# Patient Record
Sex: Female | Born: 1960 | Race: White | Hispanic: No | Marital: Married | Smoking: Never smoker
Health system: Southern US, Community
[De-identification: ages and names within clinical notes are randomized; demographics above are authoritative.]

## PROBLEM LIST (undated history)

## (undated) DIAGNOSIS — E039 Hypothyroidism, unspecified: Secondary | ICD-10-CM

## (undated) DIAGNOSIS — B019 Varicella without complication: Secondary | ICD-10-CM

## (undated) DIAGNOSIS — L570 Actinic keratosis: Secondary | ICD-10-CM

## (undated) DIAGNOSIS — R42 Dizziness and giddiness: Secondary | ICD-10-CM

## (undated) DIAGNOSIS — Z0000 Encounter for general adult medical examination without abnormal findings: Secondary | ICD-10-CM

## (undated) DIAGNOSIS — F419 Anxiety disorder, unspecified: Secondary | ICD-10-CM

## (undated) DIAGNOSIS — F329 Major depressive disorder, single episode, unspecified: Secondary | ICD-10-CM

## (undated) DIAGNOSIS — M25551 Pain in right hip: Secondary | ICD-10-CM

## (undated) DIAGNOSIS — R1013 Epigastric pain: Secondary | ICD-10-CM

## (undated) DIAGNOSIS — E559 Vitamin D deficiency, unspecified: Secondary | ICD-10-CM

## (undated) DIAGNOSIS — Z124 Encounter for screening for malignant neoplasm of cervix: Secondary | ICD-10-CM

## (undated) DIAGNOSIS — K219 Gastro-esophageal reflux disease without esophagitis: Secondary | ICD-10-CM

## (undated) DIAGNOSIS — N6019 Diffuse cystic mastopathy of unspecified breast: Secondary | ICD-10-CM

## (undated) DIAGNOSIS — E785 Hyperlipidemia, unspecified: Secondary | ICD-10-CM

## (undated) DIAGNOSIS — K589 Irritable bowel syndrome without diarrhea: Principal | ICD-10-CM

## (undated) DIAGNOSIS — C801 Malignant (primary) neoplasm, unspecified: Secondary | ICD-10-CM

## (undated) HISTORY — DX: Epigastric pain: R10.13

## (undated) HISTORY — DX: Malignant (primary) neoplasm, unspecified: C80.1

## (undated) HISTORY — DX: Varicella without complication: B01.9

## (undated) HISTORY — PX: BREAST SURGERY: SHX581

## (undated) HISTORY — DX: Encounter for general adult medical examination without abnormal findings: Z00.00

## (undated) HISTORY — DX: Hypothyroidism, unspecified: E03.9

## (undated) HISTORY — DX: Vitamin D deficiency, unspecified: E55.9

## (undated) HISTORY — PX: TONSILLECTOMY: SUR1361

## (undated) HISTORY — DX: Dizziness and giddiness: R42

## (undated) HISTORY — PX: TONSILLECTOMY: SHX5217

## (undated) HISTORY — DX: Major depressive disorder, single episode, unspecified: F32.9

## (undated) HISTORY — DX: Encounter for screening for malignant neoplasm of cervix: Z12.4

## (undated) HISTORY — DX: Actinic keratosis: L57.0

## (undated) HISTORY — PX: AXILLARY LYMPH NODE BIOPSY: SHX5737

## (undated) HISTORY — DX: Irritable bowel syndrome without diarrhea: K58.9

## (undated) HISTORY — DX: Anxiety disorder, unspecified: F41.9

## (undated) HISTORY — DX: Hyperlipidemia, unspecified: E78.5

## (undated) HISTORY — DX: Pain in right hip: M25.551

## (undated) HISTORY — DX: Diffuse cystic mastopathy of unspecified breast: N60.19

---

## 1995-12-16 HISTORY — PX: HEMORRHOID SURGERY: SHX153

## 2010-12-15 DIAGNOSIS — C801 Malignant (primary) neoplasm, unspecified: Secondary | ICD-10-CM

## 2010-12-15 HISTORY — PX: OTHER SURGICAL HISTORY: SHX169

## 2010-12-15 HISTORY — DX: Malignant (primary) neoplasm, unspecified: C80.1

## 2011-10-16 HISTORY — PX: CHOLECYSTECTOMY: SHX55

## 2011-12-16 DIAGNOSIS — E559 Vitamin D deficiency, unspecified: Secondary | ICD-10-CM

## 2011-12-16 HISTORY — DX: Vitamin D deficiency, unspecified: E55.9

## 2012-02-13 LAB — HM PAP SMEAR: HM Pap smear: NORMAL

## 2013-02-18 ENCOUNTER — Ambulatory Visit: Payer: Self-pay | Admitting: Family Medicine

## 2013-04-20 ENCOUNTER — Ambulatory Visit (INDEPENDENT_AMBULATORY_CARE_PROVIDER_SITE_OTHER): Payer: 59 | Admitting: Family Medicine

## 2013-04-20 ENCOUNTER — Encounter: Payer: Self-pay | Admitting: Family Medicine

## 2013-04-20 ENCOUNTER — Encounter: Payer: Self-pay | Admitting: Internal Medicine

## 2013-04-20 VITALS — BP 109/82 | HR 64 | Temp 98.5°F | Ht 65.0 in | Wt 143.0 lb

## 2013-04-20 DIAGNOSIS — F411 Generalized anxiety disorder: Secondary | ICD-10-CM

## 2013-04-20 DIAGNOSIS — Z124 Encounter for screening for malignant neoplasm of cervix: Secondary | ICD-10-CM

## 2013-04-20 DIAGNOSIS — N6019 Diffuse cystic mastopathy of unspecified breast: Secondary | ICD-10-CM

## 2013-04-20 DIAGNOSIS — Z0000 Encounter for general adult medical examination without abnormal findings: Secondary | ICD-10-CM

## 2013-04-20 DIAGNOSIS — Z1211 Encounter for screening for malignant neoplasm of colon: Secondary | ICD-10-CM

## 2013-04-20 DIAGNOSIS — E559 Vitamin D deficiency, unspecified: Secondary | ICD-10-CM

## 2013-04-20 DIAGNOSIS — C801 Malignant (primary) neoplasm, unspecified: Secondary | ICD-10-CM | POA: Insufficient documentation

## 2013-04-20 DIAGNOSIS — F419 Anxiety disorder, unspecified: Secondary | ICD-10-CM

## 2013-04-20 DIAGNOSIS — C4491 Basal cell carcinoma of skin, unspecified: Secondary | ICD-10-CM

## 2013-04-20 DIAGNOSIS — K589 Irritable bowel syndrome without diarrhea: Secondary | ICD-10-CM

## 2013-04-20 DIAGNOSIS — L570 Actinic keratosis: Secondary | ICD-10-CM

## 2013-04-20 HISTORY — DX: Irritable bowel syndrome without diarrhea: K58.9

## 2013-04-20 MED ORDER — ALPRAZOLAM 0.5 MG PO TABS: 0.5000 mg | ORAL_TABLET | ORAL | Status: DC

## 2013-04-20 MED ORDER — TRETINOIN (FACIAL WRINKLES) 0.02 % EX CREA: 1.0000 "application " | TOPICAL_CREAM | Freq: Every day | CUTANEOUS | Status: DC

## 2013-04-20 MED ORDER — BACITRACIN ZINC 500 UNIT/GM EX OINT: 1.0000 "application " | TOPICAL_OINTMENT | Freq: Two times a day (BID) | CUTANEOUS | Status: DC | PRN

## 2013-04-20 NOTE — Patient Instructions (Addendum)
Consider Probiotic such as Digestive Advantage Consider MegaRed Krill oil cap daily  Labs tomorrow will include lipid, renal, cbc, tsh hepatic and Vitamin D    Preventive Care for Adults, Female A healthy lifestyle and preventive care can promote health and wellness. Preventive health guidelines for women include the following key practices.  A routine yearly physical is a good way to check with your caregiver about your health and preventive screening. It is a chance to share any concerns and updates on your health, and to receive a thorough exam.  Visit your dentist for a routine exam and preventive care every 6 months. Brush your teeth twice a day and floss once a day. Good oral hygiene prevents tooth decay and gum disease.  The frequency of eye exams is based on your age, health, family medical history, use of contact lenses, and other factors. Follow your caregiver's recommendations for frequency of eye exams.  Eat a healthy diet. Foods like vegetables, fruits, whole grains, low-fat dairy products, and lean protein foods contain the nutrients you need without too many calories. Decrease your intake of foods high in solid fats, added sugars, and salt. Eat the right amount of calories for you.Get information about a proper diet from your caregiver, if necessary.  Regular physical exercise is one of the most important things you can do for your health. Most adults should get at least 150 minutes of moderate-intensity exercise (any activity that increases your heart rate and causes you to sweat) each week. In addition, most adults need muscle-strengthening exercises on 2 or more days a week.  Maintain a healthy weight. The body mass index (BMI) is a screening tool to identify possible weight problems. It provides an estimate of body fat based on height and weight. Your caregiver can help determine your BMI, and can help you achieve or maintain a healthy weight.For adults 20 years and  older:  A BMI below 18.5 is considered underweight.  A BMI of 18.5 to 24.9 is normal.  A BMI of 25 to 29.9 is considered overweight.  A BMI of 30 and above is considered obese.  Maintain normal blood lipids and cholesterol levels by exercising and minimizing your intake of saturated fat. Eat a balanced diet with plenty of fruit and vegetables. Blood tests for lipids and cholesterol should begin at age 67 and be repeated every 5 years. If your lipid or cholesterol levels are high, you are over 50, or you are at high risk for heart disease, you may need your cholesterol levels checked more frequently.Ongoing high lipid and cholesterol levels should be treated with medicines if diet and exercise are not effective.  If you smoke, find out from your caregiver how to quit. If you do not use tobacco, do not start.  If you are pregnant, do not drink alcohol. If you are breastfeeding, be very cautious about drinking alcohol. If you are not pregnant and choose to drink alcohol, do not exceed 1 drink per day. One drink is considered to be 12 ounces (355 mL) of beer, 5 ounces (148 mL) of wine, or 1.5 ounces (44 mL) of liquor.  Avoid use of street drugs. Do not share needles with anyone. Ask for help if you need support or instructions about stopping the use of drugs.  High blood pressure causes heart disease and increases the risk of stroke. Your blood pressure should be checked at least every 1 to 2 years. Ongoing high blood pressure should be treated with medicines if weight  loss and exercise are not effective.  If you are 65 to 52 years old, ask your caregiver if you should take aspirin to prevent strokes.  Diabetes screening involves taking a blood sample to check your fasting blood sugar level. This should be done once every 3 years, after age 39, if you are within normal weight and without risk factors for diabetes. Testing should be considered at a younger age or be carried out more frequently if  you are overweight and have at least 1 risk factor for diabetes.  Breast cancer screening is essential preventive care for women. You should practice "breast self-awareness." This means understanding the normal appearance and feel of your breasts and may include breast self-examination. Any changes detected, no matter how small, should be reported to a caregiver. Women in their 58s and 30s should have a clinical breast exam (CBE) by a caregiver as part of a regular health exam every 1 to 3 years. After age 70, women should have a CBE every year. Starting at age 70, women should consider having a mammography (breast X-ray test) every year. Women who have a family history of breast cancer should talk to their caregiver about genetic screening. Women at a high risk of breast cancer should talk to their caregivers about having magnetic resonance imaging (MRI) and a mammography every year.  The Pap test is a screening test for cervical cancer. A Pap test can show cell changes on the cervix that might become cervical cancer if left untreated. A Pap test is a procedure in which cells are obtained and examined from the lower end of the uterus (cervix).  Women should have a Pap test starting at age 17.  Between ages 59 and 31, Pap tests should be repeated every 2 years.  Beginning at age 29, you should have a Pap test every 3 years as long as the past 3 Pap tests have been normal.  Some women have medical problems that increase the chance of getting cervical cancer. Talk to your caregiver about these problems. It is especially important to talk to your caregiver if a new problem develops soon after your last Pap test. In these cases, your caregiver may recommend more frequent screening and Pap tests.  The above recommendations are the same for women who have or have not gotten the vaccine for human papillomavirus (HPV).  If you had a hysterectomy for a problem that was not cancer or a condition that could  lead to cancer, then you no longer need Pap tests. Even if you no longer need a Pap test, a regular exam is a good idea to make sure no other problems are starting.  If you are between ages 77 and 48, and you have had normal Pap tests going back 10 years, you no longer need Pap tests. Even if you no longer need a Pap test, a regular exam is a good idea to make sure no other problems are starting.  If you have had past treatment for cervical cancer or a condition that could lead to cancer, you need Pap tests and screening for cancer for at least 20 years after your treatment.  If Pap tests have been discontinued, risk factors (such as a new sexual partner) need to be reassessed to determine if screening should be resumed.  The HPV test is an additional test that may be used for cervical cancer screening. The HPV test looks for the virus that can cause the cell changes on the cervix.  The cells collected during the Pap test can be tested for HPV. The HPV test could be used to screen women aged 1 years and older, and should be used in women of any age who have unclear Pap test results. After the age of 44, women should have HPV testing at the same frequency as a Pap test.  Colorectal cancer can be detected and often prevented. Most routine colorectal cancer screening begins at the age of 80 and continues through age 76. However, your caregiver may recommend screening at an earlier age if you have risk factors for colon cancer. On a yearly basis, your caregiver may provide home test kits to check for hidden blood in the stool. Use of a small camera at the end of a tube, to directly examine the colon (sigmoidoscopy or colonoscopy), can detect the earliest forms of colorectal cancer. Talk to your caregiver about this at age 73, when routine screening begins. Direct examination of the colon should be repeated every 5 to 10 years through age 9, unless early forms of pre-cancerous polyps or small growths are  found.  Hepatitis C blood testing is recommended for all people born from [birth year] through 1965 and any individual with known risks for hepatitis C.  Practice safe sex. Use condoms and avoid high-risk sexual practices to reduce the spread of sexually transmitted infections (STIs). STIs include gonorrhea, chlamydia, syphilis, trichomonas, herpes, HPV, and human immunodeficiency virus (HIV). Herpes, HIV, and HPV are viral illnesses that have no cure. They can result in disability, cancer, and death. Sexually active women aged 24 and younger should be checked for chlamydia. Older women with new or multiple partners should also be tested for chlamydia. Testing for other STIs is recommended if you are sexually active and at increased risk.  Osteoporosis is a disease in which the bones lose minerals and strength with aging. This can result in serious bone fractures. The risk of osteoporosis can be identified using a bone density scan. Women ages 68 and over and women at risk for fractures or osteoporosis should discuss screening with their caregivers. Ask your caregiver whether you should take a calcium supplement or vitamin D to reduce the rate of osteoporosis.  Menopause can be associated with physical symptoms and risks. Hormone replacement therapy is available to decrease symptoms and risks. You should talk to your caregiver about whether hormone replacement therapy is right for you.  Use sunscreen with sun protection factor (SPF) of 30 or more. Apply sunscreen liberally and repeatedly throughout the day. You should seek shade when your shadow is shorter than you. Protect yourself by wearing long sleeves, pants, a wide-brimmed hat, and sunglasses year round, whenever you are outdoors.  Once a month, do a whole body skin exam, using a mirror to look at the skin on your back. Notify your caregiver of new moles, moles that have irregular borders, moles that are larger than a pencil eraser, or moles that have  changed in shape or color.  Stay current with required immunizations.  Influenza. You need a dose every fall (or winter). The composition of the flu vaccine changes each year, so being vaccinated once is not enough.  Pneumococcal polysaccharide. You need 1 to 2 doses if you smoke cigarettes or if you have certain chronic medical conditions. You need 1 dose at age 39 (or older) if you have never been vaccinated.  Tetanus, diphtheria, pertussis (Tdap, Td). Get 1 dose of Tdap vaccine if you are younger than age 59, are over  65 and have contact with an infant, are a Research scientist (physical sciences), are pregnant, or simply want to be protected from whooping cough. After that, you need a Td booster dose every 10 years. Consult your caregiver if you have not had at least 3 tetanus and diphtheria-containing shots sometime in your life or have a deep or dirty wound.  HPV. You need this vaccine if you are a woman age 67 or younger. The vaccine is given in 3 doses over 6 months.  Measles, mumps, rubella (MMR). You need at least 1 dose of MMR if you were born in 1957 or later. You may also need a second dose.  Meningococcal. If you are age 72 to 26 and a first-year college student living in a residence hall, or have one of several medical conditions, you need to get vaccinated against meningococcal disease. You may also need additional booster doses.  Zoster (shingles). If you are age 7 or older, you should get this vaccine.  Varicella (chickenpox). If you have never had chickenpox or you were vaccinated but received only 1 dose, talk to your caregiver to find out if you need this vaccine.  Hepatitis A. You need this vaccine if you have a specific risk factor for hepatitis A virus infection or you simply wish to be protected from this disease. The vaccine is usually given as 2 doses, 6 to 18 months apart.  Hepatitis B. You need this vaccine if you have a specific risk factor for hepatitis B virus infection or you  simply wish to be protected from this disease. The vaccine is given in 3 doses, usually over 6 months. Preventive Services / Frequency Ages 75 to 82  Blood pressure check.** / Every 1 to 2 years.  Lipid and cholesterol check.** / Every 5 years beginning at age 30.  Clinical breast exam.** / Every 3 years for women in their 27s and 30s.  Pap test.** / Every 2 years from ages 14 through 63. Every 3 years starting at age 60 through age 30 or 25 with a history of 3 consecutive normal Pap tests.  HPV screening.** / Every 3 years from ages 10 through ages 49 to 31 with a history of 3 consecutive normal Pap tests.  Hepatitis C blood test.** / For any individual with known risks for hepatitis C.  Skin self-exam. / Monthly.  Influenza immunization.** / Every year.  Pneumococcal polysaccharide immunization.** / 1 to 2 doses if you smoke cigarettes or if you have certain chronic medical conditions.  Tetanus, diphtheria, pertussis (Tdap, Td) immunization. / A one-time dose of Tdap vaccine. After that, you need a Td booster dose every 10 years.  HPV immunization. / 3 doses over 6 months, if you are 73 and younger.  Measles, mumps, rubella (MMR) immunization. / You need at least 1 dose of MMR if you were born in 1957 or later. You may also need a second dose.  Meningococcal immunization. / 1 dose if you are age 22 to 56 and a first-year college student living in a residence hall, or have one of several medical conditions, you need to get vaccinated against meningococcal disease. You may also need additional booster doses.  Varicella immunization.** / Consult your caregiver.  Hepatitis A immunization.** / Consult your caregiver. 2 doses, 6 to 18 months apart.  Hepatitis B immunization.** / Consult your caregiver. 3 doses usually over 6 months. Ages 14 to 93  Blood pressure check.** / Every 1 to 2 years.  Lipid and cholesterol  check.** / Every 5 years beginning at age 55.  Clinical breast  exam.** / Every year after age 1.  Mammogram.** / Every year beginning at age 45 and continuing for as long as you are in good health. Consult with your caregiver.  Pap test.** / Every 3 years starting at age 73 through age 27 or 93 with a history of 3 consecutive normal Pap tests.  HPV screening.** / Every 3 years from ages 7 through ages 60 to 39 with a history of 3 consecutive normal Pap tests.  Fecal occult blood test (FOBT) of stool. / Every year beginning at age 79 and continuing until age 53. You may not need to do this test if you get a colonoscopy every 10 years.  Flexible sigmoidoscopy or colonoscopy.** / Every 5 years for a flexible sigmoidoscopy or every 10 years for a colonoscopy beginning at age 24 and continuing until age 38.  Hepatitis C blood test.** / For all people born from [birth year] through 1965 and any individual with known risks for hepatitis C.  Skin self-exam. / Monthly.  Influenza immunization.** / Every year.  Pneumococcal polysaccharide immunization.** / 1 to 2 doses if you smoke cigarettes or if you have certain chronic medical conditions.  Tetanus, diphtheria, pertussis (Tdap, Td) immunization.** / A one-time dose of Tdap vaccine. After that, you need a Td booster dose every 10 years.  Measles, mumps, rubella (MMR) immunization. / You need at least 1 dose of MMR if you were born in 1957 or later. You may also need a second dose.  Varicella immunization.** / Consult your caregiver.  Meningococcal immunization.** / Consult your caregiver.  Hepatitis A immunization.** / Consult your caregiver. 2 doses, 6 to 18 months apart.  Hepatitis B immunization.** / Consult your caregiver. 3 doses, usually over 6 months. Ages 2 and over  Blood pressure check.** / Every 1 to 2 years.  Lipid and cholesterol check.** / Every 5 years beginning at age 9.  Clinical breast exam.** / Every year after age 48.  Mammogram.** / Every year beginning at age 70 and continuing  for as long as you are in good health. Consult with your caregiver.  Pap test.** / Every 3 years starting at age 74 through age 57 or 48 with a 3 consecutive normal Pap tests. Testing can be stopped between 65 and 70 with 3 consecutive normal Pap tests and no abnormal Pap or HPV tests in the past 10 years.  HPV screening.** / Every 3 years from ages 85 through ages 74 or 56 with a history of 3 consecutive normal Pap tests. Testing can be stopped between 65 and 70 with 3 consecutive normal Pap tests and no abnormal Pap or HPV tests in the past 10 years.  Fecal occult blood test (FOBT) of stool. / Every year beginning at age 61 and continuing until age 37. You may not need to do this test if you get a colonoscopy every 10 years.  Flexible sigmoidoscopy or colonoscopy.** / Every 5 years for a flexible sigmoidoscopy or every 10 years for a colonoscopy beginning at age 62 and continuing until age 81.  Hepatitis C blood test.** / For all people born from [birth year] through 1965 and any individual with known risks for hepatitis C.  Osteoporosis screening.** / A one-time screening for women ages 37 and over and women at risk for fractures or osteoporosis.  Skin self-exam. / Monthly.  Influenza immunization.** / Every year.  Pneumococcal polysaccharide immunization.** / 1 dose at  age 64 (or older) if you have never been vaccinated.  Tetanus, diphtheria, pertussis (Tdap, Td) immunization. / A one-time dose of Tdap vaccine if you are over 65 and have contact with an infant, are a Research scientist (physical sciences), or simply want to be protected from whooping cough. After that, you need a Td booster dose every 10 years.  Varicella immunization.** / Consult your caregiver.  Meningococcal immunization.** / Consult your caregiver.  Hepatitis A immunization.** / Consult your caregiver. 2 doses, 6 to 18 months apart.  Hepatitis B immunization.** / Check with your caregiver. 3 doses, usually over 6 months. ** Family history  and personal history of risk and conditions may change your caregiver's recommendations. Document Released: 01/27/2002 Document Revised: 02/23/2012 Document Reviewed: 04/28/2011 [facility] Patient Information 2013 [place], [place].

## 2013-04-21 ENCOUNTER — Other Ambulatory Visit: Payer: Self-pay | Admitting: Family Medicine

## 2013-04-21 LAB — HEPATIC FUNCTION PANEL
AST: 19 U/L (ref 0–37)
Bilirubin, Direct: 0.1 mg/dL (ref 0.0–0.3)
Indirect Bilirubin: 0.4 mg/dL (ref 0.0–0.9)
Total Bilirubin: 0.5 mg/dL (ref 0.3–1.2)

## 2013-04-21 LAB — RENAL FUNCTION PANEL
CO2: 27 mEq/L (ref 19–32)
Chloride: 105 mEq/L (ref 96–112)
Creat: 0.92 mg/dL (ref 0.50–1.10)
Phosphorus: 3.7 mg/dL (ref 2.3–4.6)
Potassium: 4.7 mEq/L (ref 3.5–5.3)
Sodium: 139 mEq/L (ref 135–145)

## 2013-04-21 LAB — CBC
HCT: 38.9 % (ref 36.0–46.0)
Hemoglobin: 13.1 g/dL (ref 12.0–15.0)
MCV: 92.6 fL (ref 78.0–100.0)
RBC: 4.2 MIL/uL (ref 3.87–5.11)
RDW: 12.9 % (ref 11.5–15.5)
WBC: 5.8 10*3/uL (ref 4.0–10.5)

## 2013-04-21 LAB — LIPID PANEL
LDL Cholesterol: 130 mg/dL — ABNORMAL HIGH (ref 0–99)
VLDL: 11 mg/dL (ref 0–40)

## 2013-04-22 LAB — VITAMIN D 25 HYDROXY (VIT D DEFICIENCY, FRACTURES): Vit D, 25-Hydroxy: 35 ng/mL (ref 30–89)

## 2013-04-24 ENCOUNTER — Encounter: Payer: Self-pay | Admitting: Family Medicine

## 2013-04-24 DIAGNOSIS — Z124 Encounter for screening for malignant neoplasm of cervix: Secondary | ICD-10-CM

## 2013-04-24 HISTORY — DX: Encounter for screening for malignant neoplasm of cervix: Z12.4

## 2013-04-24 NOTE — Assessment & Plan Note (Signed)
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

## 2013-04-24 NOTE — Assessment & Plan Note (Signed)
Referred to dermatology for full body scan and surveillance

## 2013-04-24 NOTE — Assessment & Plan Note (Deleted)
Referred to dermatology for full body scan and treatment. Encouraged sun screen

## 2013-04-24 NOTE — Assessment & Plan Note (Signed)
Will request old records from [place] and will return for pap at next visit.

## 2013-04-24 NOTE — Progress Notes (Signed)
Patient ID: Toni Boone, female   DOB: 1961-09-06, 52 y.o.   MRN: [id number] ILYANNA BAILLARGEON [id number] November 23, 1961 04/24/2013      Progress Note New Patient  Subjective  Chief Complaint  Chief Complaint  Patient presents with  . Establish Care    new patient    HPI  Patient is a 52 year old Caucasian female who is in today to establish care. She is here recently from [place] with her family just acute complaints does have some medical conditions that need to be followed. She struggled with irritable bowel syndrome for quite some time. Has intermittent diarrhea but no bloody or tarry stool. Is over due for her screening colonoscopy. LMP was in March of 2014. Last Pap was a year ago. She was found to have a cervical polyp on D&C but no other abnormality. She has history of basal cell carcinoma in her left shoulder as well as actinic keratosis and a strong family history of skin cancer in no acute complaints. No recent illness. No chest pain, palpitations, shortness of breath, GU complaints are noted  Past Medical History  Diagnosis Date  . Chicken pox as a child  . Anxiety   . 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 Actinic keratosis   . Vitamin D deficiency 2013  . Vitamin D deficiency   . Fibrocystic breast   . IBS (irritable bowel syndrome) 04/20/2013  . Cervical cancer screening 04/24/2013    Has a history of a cervical polyp found on evaluation of some cellular changes, D&C revealed only the polyp LMP 3/14    Past Surgical History  Procedure Laterality Date  . Basal cell removal  2012    left shoulder  . Cholecystectomy  11-12  . Tonsillectomy  as a child  . Cesarean section  89 and 95  . Breast surgery  1996 and 2012    left breast- benign  . Axillary lymph node biopsy  80's    left    Family History  Problem Relation Age of Onset  . Hypertension Mother   . Cancer Mother 15    non hodgkin lymphoma, basal, scc  . Other Mother     bilateral renal stenosis  .  Cancer Father     pituitary adenoma, basal  . Stroke Father 34  . Aneurysm Father 76    brain  . Cancer Brother     basal   . Hypertension Maternal Grandmother   . Heart attack Maternal Grandmother   . Other Maternal Grandmother     ras  . Heart attack Maternal Grandfather   . Stroke Paternal Grandmother   . Emphysema Paternal Grandfather     smoker  . COPD Paternal Grandfather     smoker  . Other Maternal Aunt     RAS/RAS  . Hypertension Maternal Aunt     History   Social History  . Marital Status: Married    Spouse Name: N/A    Number of Children: N/A  . Years of Education: N/A   Occupational History  . Not on file.   Social History Main Topics  . Smoking status: Never Smoker   . Smokeless tobacco: Never Used  . Alcohol Use: Yes     Comment: socially  . Drug Use: No  . Sexually Active: Yes -- Female partner(s)   Other Topics Concern  . Not on file   Social History Narrative  . No narrative on file    No current  outpatient prescriptions on file prior to visit.   No current facility-administered medications on file prior to visit.    Allergies  Allergen Reactions  . Avelox (Moxifloxacin Hcl In Nacl) Anaphylaxis  . Codeine     GI upset  . Macrodantin (Nitrofurantoin Macrocrystal)     Burning throat  . Penicillins     Review of Systems  Review of Systems  Constitutional: Negative for fever, chills and malaise/fatigue.  HENT: Negative for hearing loss, nosebleeds and congestion.   Eyes: Negative for discharge.  Respiratory: Negative for cough, sputum production, shortness of breath and wheezing.   Cardiovascular: Negative for chest pain, palpitations and leg swelling.  Gastrointestinal: Positive for diarrhea. Negative for heartburn, nausea, vomiting, abdominal pain, constipation and blood in stool.  Genitourinary: Negative for dysuria, urgency, frequency and hematuria.  Musculoskeletal: Negative for myalgias, back pain and falls.  Skin: Negative  for rash.  Neurological: Negative for dizziness, tremors, sensory change, focal weakness, loss of consciousness, weakness and headaches.  Endo/Heme/Allergies: Negative for polydipsia. Does not bruise/bleed easily.  Psychiatric/Behavioral: Negative for depression and suicidal ideas. The patient is nervous/anxious. The patient does not have insomnia.     Objective  BP 109/82  Pulse 64  Temp(Src) 98.5 F (36.9 C) (Oral)  Ht 5\' 5"  (1.651 m)  Wt 143 lb (64.864 kg)  BMI 23.8 kg/m2  SpO2 97%  LMP 02/20/2013  Physical Exam  Physical Exam  Constitutional: She is oriented to person, place, and time and well-developed, well-nourished, and in no distress. No distress.  HENT:  Head: Normocephalic and atraumatic.  Right Ear: External ear normal.  Left Ear: External ear normal.  Nose: Nose normal.  Mouth/Throat: Oropharynx is clear and moist. No oropharyngeal exudate.  Eyes: Conjunctivae are normal. Pupils are equal, round, and reactive to light. Right eye exhibits no discharge. Left eye exhibits no discharge. No scleral icterus.  Neck: Normal range of motion. Neck supple. No thyromegaly present.  Cardiovascular: Normal rate, regular rhythm, normal heart sounds and intact distal pulses.   No murmur heard. Pulmonary/Chest: Effort normal and breath sounds normal. No respiratory distress. She has no wheezes. She has no rales.  Abdominal: Soft. Bowel sounds are normal. She exhibits no distension and no mass. There is no tenderness.  Musculoskeletal: Normal range of motion. She exhibits no edema and no tenderness.  Lymphadenopathy:    She has no cervical adenopathy.  Neurological: She is alert and oriented to person, place, and time. She has normal reflexes. No cranial nerve deficit. Coordination normal.  Skin: Skin is warm and dry. No rash noted. She is not diaphoretic.  Psychiatric: Mood, memory and affect normal.       Assessment & Plan  Vitamin D deficiency Getting less sun now.  Supplementing infrequently. Using Vitamin D 800 IU roughly twice a month. Will ceck level with labs.  IBS (irritable bowel syndrome) Encouraged Probiotics, fiber supplements and heart healthy diet. Is in need of screening colonoscopy. Is referred today.  Cancer Referred to dermatology for full body scan and surveillance  Fibrocystic breast She reports she usually gets Diagnostic MGMs due to h/o recurrent abnomalities  Anxiety Uses Alprazolam very infrequently.  Cervical cancer screening Will request old records from [place] and will return for pap at next visit.

## 2013-04-24 NOTE — Assessment & Plan Note (Signed)
Uses Alprazolam very infrequently.

## 2013-04-24 NOTE — Assessment & Plan Note (Addendum)
Encouraged Probiotics, fiber supplements and heart healthy diet. Is in need of screening colonoscopy. Is referred today.

## 2013-04-24 NOTE — Assessment & Plan Note (Signed)
Getting less sun now. Supplementing infrequently. Using Vitamin D 800 IU roughly twice a month. Will ceck level with labs.

## 2013-04-25 ENCOUNTER — Telehealth: Payer: Self-pay | Admitting: *Deleted

## 2013-04-25 NOTE — Telephone Encounter (Signed)
I called to inform pharmacy and she stated that the Tretinoin is only in .05, .025 and .1? Please advise which strength?

## 2013-04-25 NOTE — Telephone Encounter (Signed)
I attempted to just order the generic Tretenoin but you how  The machine just orders with both names. Please call the pharmacy and give an order with same sig to give the generic, thanks

## 2013-04-25 NOTE — Telephone Encounter (Signed)
Try the 0.05 strength version

## 2013-04-25 NOTE — Telephone Encounter (Signed)
Received fax from [facility] that Renova is not covered under pt's insurance. Wants to know if it can be changed to tretenoin?  Please advise.

## 2013-04-26 ENCOUNTER — Telehealth: Payer: Self-pay

## 2013-04-26 DIAGNOSIS — R7989 Other specified abnormal findings of blood chemistry: Secondary | ICD-10-CM

## 2013-04-26 MED ORDER — TRETINOIN 0.05 % EX CREA: TOPICAL_CREAM | Freq: Every day | CUTANEOUS | Status: DC

## 2013-04-26 NOTE — Telephone Encounter (Signed)
Lab order placed.

## 2013-05-04 ENCOUNTER — Ambulatory Visit (AMBULATORY_SURGERY_CENTER): Payer: 59 | Admitting: *Deleted

## 2013-05-04 VITALS — Ht 65.0 in | Wt 145.6 lb

## 2013-05-04 DIAGNOSIS — Z1211 Encounter for screening for malignant neoplasm of colon: Secondary | ICD-10-CM

## 2013-05-04 MED ORDER — MOVIPREP 100 G PO SOLR: ORAL | Status: DC

## 2013-05-19 ENCOUNTER — Ambulatory Visit (AMBULATORY_SURGERY_CENTER): Payer: 59 | Admitting: Internal Medicine

## 2013-05-19 ENCOUNTER — Encounter: Payer: Self-pay | Admitting: Internal Medicine

## 2013-05-19 VITALS — BP 107/65 | HR 55 | Temp 98.5°F | Resp 19 | Ht 65.0 in | Wt 145.0 lb

## 2013-05-19 DIAGNOSIS — Z1211 Encounter for screening for malignant neoplasm of colon: Secondary | ICD-10-CM

## 2013-05-19 MED ORDER — SODIUM CHLORIDE 0.9 % IV SOLN: 500.0000 mL | INTRAVENOUS | Status: DC

## 2013-05-19 NOTE — Op Note (Signed)
[facility] [address] [place], [address]   COLONOSCOPY PROCEDURE REPORT  PATIENT: Toni Boone, Toni Boone  MR#: [id number] BIRTHDATE: Dec 03, 1961 , 51  yrs. old GENDER: Female ENDOSCOPIST: Beverley Fiedler, MD REFERRED XB:JYNWG Abner Greenspan, M.D. PROCEDURE DATE:  05/19/2013 PROCEDURE:   Colonoscopy, screening ASA CLASS:   Class II INDICATIONS:average risk screening and Last colonoscopy performed 12 years ago (for rectal bleeding). MEDICATIONS: MAC sedation, administered by CRNA and propofol (Diprivan) 250mg  IV  DESCRIPTION OF PROCEDURE:   After the risks benefits and alternatives of the procedure were thoroughly explained, informed consent was obtained.  A digital rectal exam revealed no rectal mass.   The LB [id number]-[id number]  endoscope was introduced through the anus and advanced to the cecum, which was identified by both the appendix and ileocecal valve. No adverse events experienced. The quality of the prep was good, using MoviPrep  The instrument was then slowly withdrawn as the colon was fully examined.      COLON FINDINGS: A normal appearing cecum, ileocecal valve, and appendiceal orifice were identified.  The ascending, hepatic flexure, transverse, splenic flexure, descending, sigmoid colon and rectum appeared unremarkable.  No polyps or cancers were seen. Retroflexed views revealed no abnormalities. The time to cecum=3 minutes 25 seconds.  Withdrawal time=8 minutes 29 seconds.  The scope was withdrawn and the procedure completed.  COMPLICATIONS: There were no complications.  ENDOSCOPIC IMPRESSION: Normal colon  RECOMMENDATIONS: You should continue to follow colorectal cancer screening guidelines for "routine risk" patients with a repeat colonoscopy in 10 years. There is no need for FOBT (stool) testing for at least 5 years.   eSigned:  Beverley Fiedler, MD 05/19/2013 10:58 AM   cc: The Patient

## 2013-05-19 NOTE — Patient Instructions (Addendum)
YOU HAD AN ENDOSCOPIC PROCEDURE TODAY AT THE [facility]: Refer to the procedure report that was given to you for any specific questions about what was found during the examination.  If the procedure report does not answer your questions, please call your gastroenterologist to clarify.  If you requested that your care partner not be given the details of your procedure findings, then the procedure report has been included in a sealed envelope for you to review at your convenience later.  YOU SHOULD EXPECT: Some feelings of bloating in the abdomen. Passage of more gas than usual.  Walking can help get rid of the air that was put into your GI tract during the procedure and reduce the bloating. If you had a lower endoscopy (such as a colonoscopy or flexible sigmoidoscopy) you may notice spotting of blood in your stool or on the toilet paper. If you underwent a bowel prep for your procedure, then you may not have a normal bowel movement for a few days.  DIET: Your first meal following the procedure should be a light meal and then it is ok to progress to your normal diet.  A half-sandwich or bowl of soup is an example of a good first meal.  Heavy or fried foods are harder to digest and may make you feel nauseous or bloated.  Likewise meals heavy in dairy and vegetables can cause extra gas to form and this can also increase the bloating.  Drink plenty of fluids but you should avoid alcoholic beverages for 24 hours.  ACTIVITY: Your care partner should take you home directly after the procedure.  You should plan to take it easy, moving slowly for the rest of the day.  You can resume normal activity the day after the procedure however you should NOT DRIVE or use heavy machinery for 24 hours (because of the sedation medicines used during the test).    SYMPTOMS TO REPORT IMMEDIATELY: A gastroenterologist can be reached at any hour.  During normal business hours, 8:30 AM to 5:00 PM Monday through Friday,  call [phone].  After hours and on weekends, please call the GI answering service at [phone] who will take a message and have the physician on call contact you.   Following lower endoscopy (colonoscopy or flexible sigmoidoscopy):  Excessive amounts of blood in the stool  Significant tenderness or worsening of abdominal pains  Swelling of the abdomen that is new, acute  Fever of 100F or higher  FOLLOW UP: If any biopsies were taken you will be contacted by phone or by letter within the next 1-3 weeks.  Call your gastroenterologist if you have not heard about the biopsies in 3 weeks.  Our staff will call the home number listed on your records the next business day following your procedure to check on you and address any questions or concerns that you may have at that time regarding the information given to you following your procedure. This is a courtesy call and so if there is no answer at the home number and we have not heard from you through the emergency physician on call, we will assume that you have returned to your regular daily activities without incident.  SIGNATURES/CONFIDENTIALITY: You and/or your care partner have signed paperwork which will be entered into your electronic medical record.  These signatures attest to the fact that that the information above on your After Visit Summary has been reviewed and is understood.  Full responsibility of the confidentiality of this   discharge information lies with you and/or your care-partner.  Repeat colonoscopy in 10 years.  

## 2013-05-19 NOTE — Progress Notes (Signed)
Patient did not experience any of the following events: a burn prior to discharge; a fall within the facility; wrong site/side/patient/procedure/implant event; or a hospital transfer or hospital admission upon discharge from the facility. (G8907) Patient did not have preoperative order for IV antibiotic SSI prophylaxis. (G8918)  

## 2013-05-20 ENCOUNTER — Telehealth: Payer: Self-pay | Admitting: *Deleted

## 2013-05-20 NOTE — Telephone Encounter (Signed)
Left message that we called for f/u 

## 2013-06-02 ENCOUNTER — Ambulatory Visit (INDEPENDENT_AMBULATORY_CARE_PROVIDER_SITE_OTHER): Payer: 59 | Admitting: Family Medicine

## 2013-06-02 ENCOUNTER — Encounter: Payer: Self-pay | Admitting: Family Medicine

## 2013-06-02 ENCOUNTER — Other Ambulatory Visit (HOSPITAL_COMMUNITY)
Admission: RE | Admit: 2013-06-02 | Discharge: 2013-06-02 | Disposition: A | Discharge status: Home / Self Care | Payer: 59 | Source: Ambulatory Visit | Attending: Family Medicine | Admitting: Family Medicine

## 2013-06-02 VITALS — BP 100/82 | HR 55 | Temp 98.2°F | Ht 65.0 in | Wt 143.1 lb

## 2013-06-02 DIAGNOSIS — Z0000 Encounter for general adult medical examination without abnormal findings: Secondary | ICD-10-CM

## 2013-06-02 DIAGNOSIS — N644 Mastodynia: Secondary | ICD-10-CM

## 2013-06-02 DIAGNOSIS — R946 Abnormal results of thyroid function studies: Secondary | ICD-10-CM

## 2013-06-02 DIAGNOSIS — Z124 Encounter for screening for malignant neoplasm of cervix: Secondary | ICD-10-CM

## 2013-06-02 DIAGNOSIS — N6019 Diffuse cystic mastopathy of unspecified breast: Secondary | ICD-10-CM

## 2013-06-02 DIAGNOSIS — Z01419 Encounter for gynecological examination (general) (routine) without abnormal findings: Secondary | ICD-10-CM | POA: Insufficient documentation

## 2013-06-02 DIAGNOSIS — Z1239 Encounter for other screening for malignant neoplasm of breast: Secondary | ICD-10-CM

## 2013-06-02 DIAGNOSIS — E785 Hyperlipidemia, unspecified: Secondary | ICD-10-CM

## 2013-06-02 DIAGNOSIS — K589 Irritable bowel syndrome without diarrhea: Secondary | ICD-10-CM

## 2013-06-02 DIAGNOSIS — E559 Vitamin D deficiency, unspecified: Secondary | ICD-10-CM

## 2013-06-02 HISTORY — DX: Encounter for general adult medical examination without abnormal findings: Z00.00

## 2013-06-02 HISTORY — DX: Hyperlipidemia, unspecified: E78.5

## 2013-06-02 LAB — TSH: TSH: 5.905 u[IU]/mL — ABNORMAL HIGH (ref 0.350–4.500)

## 2013-06-02 NOTE — Assessment & Plan Note (Signed)
Avoid offending foods and consider probiotics

## 2013-06-02 NOTE — Assessment & Plan Note (Signed)
Avoid trans fats, start krill oil caps, increase exercise. Recheck annually

## 2013-06-02 NOTE — Assessment & Plan Note (Signed)
Improved on recent labs.

## 2013-06-02 NOTE — Assessment & Plan Note (Addendum)
Tenderness and fullness noted in upper, outer quadrant of right breast, time for annual mgm, will order diagnostic mgm

## 2013-06-02 NOTE — Assessment & Plan Note (Signed)
Pap today, MGM ordered, annual labs reviewed with patient

## 2013-06-02 NOTE — Assessment & Plan Note (Addendum)
Pap done today, no concerns on exam 

## 2013-06-02 NOTE — Progress Notes (Signed)
Patient ID: Toni Boone, female   DOB: 08-06-1961, 52 y.o.   MRN: [id number] Toni Boone [id number] 08-20-1961 06/02/2013      Progress Note-Follow Up  Subjective  Chief Complaint  Chief Complaint  Patient presents with  . Gynecologic Exam    breast exam and pap    HPI  This is a 52 year old Caucasian female in GYN exam. She is doing well. Labs are reviewed with her today that visit. She's not had any recent illness. No chest pain, palpitations, shortness of breath, GI or GU concerns noted today. She does note she has a history of fibrocystic breast disease with multiple biopsies on the left in the past. Presently on in the right breast upper outer quadrant. No discharge or other complaints.  Past Medical History  Diagnosis Date  . Chicken pox as a child  . Anxiety   . 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 Actinic keratosis   . Vitamin D deficiency 2013  . Vitamin D deficiency   . Fibrocystic breast   . IBS (irritable bowel syndrome) 04/20/2013  . Cervical cancer screening 04/24/2013    Has a history of a cervical polyp found on evaluation of some cellular changes, D&C revealed only the polyp LMP 3/14  . Other and unspecified hyperlipidemia 06/02/2013    Past Surgical History  Procedure Laterality Date  . Basal cell removal  2012    left shoulder  . Cholecystectomy  11-12  . Tonsillectomy  as a child  . Cesarean section  94 and 95  . Breast surgery  1996 and 2012    left breast- benign  . Axillary lymph node biopsy  80's    left  . Hemorrhoid surgery  1997    Family History  Problem Relation Age of Onset  . Hypertension Mother   . Cancer Mother 54    non hodgkin lymphoma, basal, scc  . Other Mother     bilateral renal stenosis  . Cancer Father     pituitary adenoma, basal  . Stroke Father 20  . Aneurysm Father 76    brain  . Cancer Brother     basal   . Hypertension Maternal Grandmother   . Heart attack Maternal Grandmother   . Other Maternal  Grandmother     ras  . Heart attack Maternal Grandfather   . Stroke Paternal Grandmother   . Emphysema Paternal Grandfather     smoker  . COPD Paternal Grandfather     smoker  . Other Maternal Aunt     RAS/RAS  . Hypertension Maternal Aunt   . Colon cancer Neg Hx     History   Social History  . Marital Status: Married    Spouse Name: N/A    Number of Children: N/A  . Years of Education: N/A   Occupational History  . Not on file.   Social History Main Topics  . Smoking status: Never Smoker   . Smokeless tobacco: Never Used  . Alcohol Use: 2.4 oz/week    4 Glasses of wine per week  . Drug Use: No  . Sexually Active: Yes -- Female partner(s)   Other Topics Concern  . Not on file   Social History Narrative  . No narrative on file    Current Outpatient Prescriptions on File Prior to Visit  Medication Sig Dispense Refill  . ALPRAZolam (XANAX) 0.5 MG tablet Take 1 tablet (0.5 mg total) by mouth as directed. 1/2 to  1 tablet prn  30 tablet  1  . bacitracin ointment Apply 1 application topically 2 (two) times daily as needed.  120 g  5  . tretinoin (RETIN-A) 0.05 % cream Apply topically at bedtime.  45 g  1   No current facility-administered medications on file prior to visit.    Allergies  Allergen Reactions  . Avelox (Moxifloxacin Hcl In Nacl) Anaphylaxis  . Penicillins Anaphylaxis  . Codeine     GI upset  . Macrodantin (Nitrofurantoin Macrocrystal)     Burning throat    Review of Systems  Review of Systems  Constitutional: Negative for fever, chills and malaise/fatigue.  HENT: Negative for hearing loss, nosebleeds and congestion.   Eyes: Negative for pain and discharge.  Respiratory: Negative for cough, sputum production, shortness of breath and wheezing.   Cardiovascular: Negative for chest pain, palpitations and leg swelling.  Gastrointestinal: Negative for heartburn, nausea, vomiting, abdominal pain, diarrhea, constipation and blood in stool.   Genitourinary: Negative for dysuria, urgency, frequency and hematuria.  Musculoskeletal: Negative for myalgias, back pain and falls.  Skin: Negative for rash.  Neurological: Negative for dizziness, tremors, sensory change, focal weakness, loss of consciousness, weakness and headaches.  Endo/Heme/Allergies: Negative for polydipsia. Does not bruise/bleed easily.  Psychiatric/Behavioral: Negative for depression and suicidal ideas. The patient is nervous/anxious. The patient does not have insomnia.     Objective  BP 100/82  Pulse 55  Temp(Src) 98.2 F (36.8 C) (Oral)  Ht 5\' 5"  (1.651 m)  Wt 143 lb 1.3 oz (64.901 kg)  BMI 23.81 kg/m2  SpO2 97%  LMP 05/13/2013  Physical Exam  Physical Exam  Constitutional: She is oriented to person, place, and time and well-developed, well-nourished, and in no distress. No distress.  HENT:  Head: Normocephalic and atraumatic.  Eyes: Conjunctivae are normal.  Neck: Neck supple. No thyromegaly present.  Cardiovascular: Normal rate, regular rhythm and normal heart sounds.   No murmur heard. Pulmonary/Chest: Effort normal and breath sounds normal. She has no wheezes.  Abdominal: She exhibits no distension and no mass.  Genitourinary: Vagina normal, uterus normal, cervix normal, right adnexa normal and left adnexa normal. No vaginal discharge found.  Right breast upper outer quadrant,increased density without discreet lesion. No discharge or skin changes  Musculoskeletal: She exhibits no edema.  Lymphadenopathy:    She has no cervical adenopathy.  Neurological: She is alert and oriented to person, place, and time.  Skin: Skin is warm and dry. No rash noted. She is not diaphoretic.  Psychiatric: Memory, affect and judgment normal.    Lab Results  Component Value Date   TSH 5.014* 04/21/2013   Lab Results  Component Value Date   WBC 5.8 04/21/2013   HGB 13.1 04/21/2013   HCT 38.9 04/21/2013   MCV 92.6 04/21/2013   PLT 246 04/21/2013   Lab Results   Component Value Date   CREATININE 0.92 04/21/2013   BUN 13 04/21/2013   NA 139 04/21/2013   K 4.7 04/21/2013   CL 105 04/21/2013   CO2 27 04/21/2013   Lab Results  Component Value Date   ALT 17 04/21/2013   AST 19 04/21/2013   ALKPHOS 47 04/21/2013   BILITOT 0.5 04/21/2013   Lab Results  Component Value Date   CHOL 199 04/21/2013   Lab Results  Component Value Date   HDL 58 04/21/2013   Lab Results  Component Value Date   LDLCALC 130* 04/21/2013   Lab Results  Component Value Date  TRIG 54 04/21/2013   Lab Results  Component Value Date   CHOLHDL 3.4 04/21/2013   A/P  Cervical cancer screening Pap done today, no concerns on exam  Fibrocystic breast Tenderness and fullness noted in upper, outer quadrant of right breast, time for annual mgm, will order diagnostic mgm  Other and unspecified hyperlipidemia Avoid trans fats, start krill oil caps, increase exercise. Recheck annually  Vitamin D deficiency Improved on recent labs  IBS (irritable bowel syndrome) Avoid offending foods and consider probiotics  Preventative health care Pap today, MGM ordered, annual labs reviewed with patient

## 2013-06-02 NOTE — Patient Instructions (Addendum)
Needs TSH and Free T4 in 3 months Next visit annual with labs prior lipid, renal, cbc, tsh, freeT4, hepatic, vitamin d   Hypothyroidism The thyroid is a large gland located in the lower front of your neck. The thyroid gland helps control metabolism. Metabolism is how your body handles food. It controls metabolism with the hormone thyroxine. When this gland is underactive (hypothyroid), it produces too little hormone.  CAUSES These include:   Absence or destruction of thyroid tissue.  Goiter due to iodine deficiency.  Goiter due to medications.  Congenital defects (since birth).  Problems with the pituitary. This causes a lack of TSH (thyroid stimulating hormone). This hormone tells the thyroid to turn out more hormone. SYMPTOMS  Lethargy (feeling as though you have no energy)  Cold intolerance  Weight gain (in spite of normal food intake)  Dry skin  Coarse hair  Menstrual irregularity (if severe, may lead to infertility)  Slowing of thought processes Cardiac problems are also caused by insufficient amounts of thyroid hormone. Hypothyroidism in the newborn is cretinism, and is an extreme form. It is important that this form be treated adequately and immediately or it will lead rapidly to retarded physical and mental development. DIAGNOSIS  To prove hypothyroidism, your caregiver may do blood tests and ultrasound tests. Sometimes the signs are hidden. It may be necessary for your caregiver to watch this illness with blood tests either before or after diagnosis and treatment. TREATMENT  Low levels of thyroid hormone are increased by using synthetic thyroid hormone. This is a safe, effective treatment. It usually takes about four weeks to gain the full effects of the medication. After you have the full effect of the medication, it will generally take another four weeks for problems to leave. Your caregiver may start you on low doses. If you have had heart problems the dose may be  gradually increased. It is generally not an emergency to get rapidly to normal. HOME CARE INSTRUCTIONS   Take your medications as your caregiver suggests. Let your caregiver know of any medications you are taking or start taking. Your caregiver will help you with dosage schedules.  As your condition improves, your dosage needs may increase. It will be necessary to have continuing blood tests as suggested by your caregiver.  Report all suspected medication side effects to your caregiver. SEEK MEDICAL CARE IF: Seek medical care if you develop:  Sweating.  Tremulousness (tremors).  Anxiety.  Rapid weight loss.  Heat intolerance.  Emotional swings.  Diarrhea.  Weakness. SEEK IMMEDIATE MEDICAL CARE IF:  You develop chest pain, an irregular heart beat (palpitations), or a rapid heart beat. MAKE SURE YOU:   Understand these instructions.  Will watch your condition.  Will get help right away if you are not doing well or get worse. Document Released: 12/01/2005 Document Revised: 02/23/2012 Document Reviewed: 07/21/2008 [facility] Patient Information 2014 [place], [place].

## 2013-06-06 ENCOUNTER — Telehealth: Payer: Self-pay

## 2013-06-06 NOTE — Telephone Encounter (Signed)
Pt informed

## 2013-06-06 NOTE — Telephone Encounter (Signed)
Message copied by Court Joy on Mon Jun 06, 2013  5:06 PM ------      Message from: Danise Edge A      Created: Mon Jun 06, 2013  5:05 PM       Please notify normal pap ------

## 2013-06-27 ENCOUNTER — Other Ambulatory Visit: Payer: Self-pay | Admitting: Family Medicine

## 2013-06-27 ENCOUNTER — Ambulatory Visit
Admission: RE | Admit: 2013-06-27 | Discharge: 2013-06-27 | Disposition: A | Discharge status: Home / Self Care | Payer: 59 | Source: Ambulatory Visit | Attending: Family Medicine | Admitting: Family Medicine

## 2013-06-27 DIAGNOSIS — N644 Mastodynia: Secondary | ICD-10-CM

## 2013-06-27 DIAGNOSIS — Z1239 Encounter for other screening for malignant neoplasm of breast: Secondary | ICD-10-CM

## 2013-07-04 ENCOUNTER — Ambulatory Visit (HOSPITAL_BASED_OUTPATIENT_CLINIC_OR_DEPARTMENT_OTHER): Payer: 59

## 2013-08-10 ENCOUNTER — Telehealth: Payer: Self-pay

## 2013-08-10 DIAGNOSIS — R7989 Other specified abnormal findings of blood chemistry: Secondary | ICD-10-CM

## 2013-08-10 MED ORDER — LEVOTHYROXINE SODIUM 25 MCG PO TABS: 25.0000 ug | ORAL_TABLET | Freq: Every day | ORAL | Status: DC

## 2013-08-10 NOTE — Telephone Encounter (Signed)
I am willing to do that, start Levothyroxine 25 mcg po daily disp #30 with 2 refills and recheck TSH and free T4 in 10 to 12 weeks

## 2013-08-10 NOTE — Telephone Encounter (Signed)
Patient informed. RX sent and labs ordered

## 2013-08-10 NOTE — Telephone Encounter (Signed)
Pt left a message stating that since her thyroid test was borderline, she would like to start on the lowest dose of thyroid replacement to see if this helps with her not having any energy and tired all the time?  Please advise?

## 2013-10-20 ENCOUNTER — Other Ambulatory Visit: Payer: Self-pay

## 2013-11-03 ENCOUNTER — Other Ambulatory Visit (INDEPENDENT_AMBULATORY_CARE_PROVIDER_SITE_OTHER): Payer: 59

## 2013-11-03 DIAGNOSIS — E039 Hypothyroidism, unspecified: Secondary | ICD-10-CM

## 2013-11-29 ENCOUNTER — Other Ambulatory Visit: Payer: Self-pay | Admitting: Family Medicine

## 2014-03-15 ENCOUNTER — Other Ambulatory Visit: Payer: Self-pay | Admitting: Family Medicine

## 2014-06-05 ENCOUNTER — Encounter: Payer: 59 | Admitting: Family Medicine

## 2014-06-19 ENCOUNTER — Other Ambulatory Visit: Payer: Self-pay | Admitting: Dermatology

## 2014-07-11 ENCOUNTER — Encounter: Payer: Self-pay | Admitting: Family

## 2014-07-11 ENCOUNTER — Telehealth (INDEPENDENT_AMBULATORY_CARE_PROVIDER_SITE_OTHER): Payer: 59

## 2014-07-11 DIAGNOSIS — R350 Frequency of micturition: Secondary | ICD-10-CM

## 2014-07-11 LAB — POCT URINALYSIS DIPSTICK
Glucose, UA: NEGATIVE
Leukocytes, UA: NEGATIVE
NITRITE UA: NEGATIVE
PH UA: 5
RBC UA: NEGATIVE
SPEC GRAV UA: 1.02
Urobilinogen, UA: 0.2

## 2014-07-11 MED ORDER — CEFUROXIME AXETIL 500 MG PO TABS: 500.0000 mg | ORAL_TABLET | Freq: Two times a day (BID) | ORAL | Status: DC

## 2014-07-11 NOTE — Telephone Encounter (Signed)
Patient is having urinary frequency and per Melissa check a urinalysis and culture  Orders placed

## 2014-07-11 NOTE — Telephone Encounter (Signed)
Pt reports symptoms present x 3 weeks.  Requests rx for ceftin.  + Pen allergy, but reports she has taken ceftin and has tolerated in past.

## 2014-07-14 ENCOUNTER — Encounter: Payer: Self-pay | Admitting: Family

## 2014-07-14 LAB — CULTURE, URINE COMPREHENSIVE: Colony Count: 10000

## 2014-07-17 ENCOUNTER — Other Ambulatory Visit: Payer: Self-pay | Admitting: Family Medicine

## 2014-07-28 ENCOUNTER — Encounter (HOSPITAL_COMMUNITY): Payer: Self-pay | Admitting: Emergency Medicine

## 2014-07-28 ENCOUNTER — Emergency Department (HOSPITAL_COMMUNITY): Payer: 59

## 2014-07-28 ENCOUNTER — Emergency Department (HOSPITAL_COMMUNITY): Admission: EM | Admit: 2014-07-28 | Discharge: 2014-07-28 | Discharge status: Against Medical Advice | Payer: Self-pay

## 2014-07-28 ENCOUNTER — Emergency Department (HOSPITAL_COMMUNITY)
Admission: EM | Admit: 2014-07-28 | Discharge: 2014-07-28 | Disposition: A | Discharge status: Home / Self Care | Payer: 59 | Attending: Emergency Medicine | Admitting: Emergency Medicine

## 2014-07-28 DIAGNOSIS — Z8742 Personal history of other diseases of the female genital tract: Secondary | ICD-10-CM | POA: Diagnosis not present

## 2014-07-28 DIAGNOSIS — F411 Generalized anxiety disorder: Secondary | ICD-10-CM | POA: Insufficient documentation

## 2014-07-28 DIAGNOSIS — R200 Anesthesia of skin: Secondary | ICD-10-CM

## 2014-07-28 DIAGNOSIS — R202 Paresthesia of skin: Secondary | ICD-10-CM

## 2014-07-28 DIAGNOSIS — Z8619 Personal history of other infectious and parasitic diseases: Secondary | ICD-10-CM | POA: Insufficient documentation

## 2014-07-28 DIAGNOSIS — Z880 Allergy status to penicillin: Secondary | ICD-10-CM | POA: Insufficient documentation

## 2014-07-28 DIAGNOSIS — Z85828 Personal history of other malignant neoplasm of skin: Secondary | ICD-10-CM | POA: Diagnosis not present

## 2014-07-28 DIAGNOSIS — R110 Nausea: Secondary | ICD-10-CM | POA: Diagnosis not present

## 2014-07-28 DIAGNOSIS — Z8719 Personal history of other diseases of the digestive system: Secondary | ICD-10-CM | POA: Diagnosis not present

## 2014-07-28 DIAGNOSIS — R519 Headache, unspecified: Secondary | ICD-10-CM

## 2014-07-28 DIAGNOSIS — R51 Headache: Secondary | ICD-10-CM | POA: Insufficient documentation

## 2014-07-28 DIAGNOSIS — R209 Unspecified disturbances of skin sensation: Secondary | ICD-10-CM | POA: Insufficient documentation

## 2014-07-28 DIAGNOSIS — Z79899 Other long term (current) drug therapy: Secondary | ICD-10-CM | POA: Insufficient documentation

## 2014-07-28 DIAGNOSIS — Z872 Personal history of diseases of the skin and subcutaneous tissue: Secondary | ICD-10-CM | POA: Diagnosis not present

## 2014-07-28 DIAGNOSIS — R55 Syncope and collapse: Secondary | ICD-10-CM | POA: Diagnosis not present

## 2014-07-28 LAB — CBC
HCT: 40.4 % (ref 36.0–46.0)
Hemoglobin: 14 g/dL (ref 12.0–15.0)
MCH: 31.7 pg (ref 26.0–34.0)
MCHC: 34.7 g/dL (ref 30.0–36.0)
MCV: 91.6 fL (ref 78.0–100.0)
Platelets: 266 10*3/uL (ref 150–400)
RBC: 4.41 MIL/uL (ref 3.87–5.11)
RDW: 12.2 % (ref 11.5–15.5)
WBC: 7.3 10*3/uL (ref 4.0–10.5)

## 2014-07-28 LAB — BASIC METABOLIC PANEL
Anion gap: 14 (ref 5–15)
BUN: 14 mg/dL (ref 6–23)
CO2: 25 mEq/L (ref 19–32)
Calcium: 9.7 mg/dL (ref 8.4–10.5)
Chloride: 101 mEq/L (ref 96–112)
Creatinine, Ser: 0.95 mg/dL (ref 0.50–1.10)
GFR calc Af Amer: 78 mL/min — ABNORMAL LOW (ref >90)
GFR calc non Af Amer: 67 mL/min — ABNORMAL LOW (ref >90)
Glucose, Bld: 98 mg/dL (ref 70–99)
Potassium: 3.5 mEq/L — ABNORMAL LOW (ref 3.7–5.3)
Sodium: 140 mEq/L (ref 137–147)

## 2014-07-28 LAB — CSF CELL COUNT WITH DIFFERENTIAL
RBC COUNT CSF: 1 /mm3 — AB
RBC COUNT CSF: 63 /mm3 — AB
Tube #: 1
Tube #: 3
WBC CSF: 0 /mm3 (ref 0–5)
WBC, CSF: 0 /mm3 (ref 0–5)

## 2014-07-28 LAB — GLUCOSE, CSF: Glucose, CSF: 60 mg/dL (ref 43–76)

## 2014-07-28 LAB — PROTEIN, CSF: TOTAL PROTEIN, CSF: 38 mg/dL (ref 15–45)

## 2014-07-28 MED ORDER — LIDOCAINE-EPINEPHRINE 2 %-1:100000 IJ SOLN
20.0000 mL | Freq: Once | INTRAMUSCULAR | Status: DC
  Filled 2014-07-28: qty 1

## 2014-07-28 MED ORDER — LORAZEPAM 1 MG PO TABS
1.0000 mg | ORAL_TABLET | Freq: Once | ORAL | Status: AC
  Administered 2014-07-28: 1 mg via ORAL
  Filled 2014-07-28: qty 1

## 2014-07-28 NOTE — ED Provider Notes (Signed)
CSN: [id number]     Arrival date & time 07/28/14  1520 History   First MD Initiated Contact with Patient 07/28/14 1604     Chief Complaint  Patient presents with  . Headache  . Nausea  . Numbness     (Consider location/radiation/quality/duration/timing/severity/associated sxs/prior Treatment) HPI Pt is a 53yo female with hx of anxiety, IBS, and hyperlipidemia presenting to ED with c/o sudden onset sharp pain to top of her head then numbness to right side of her face and right arm while she was shopping earlier today.  Pt also reports episode of near syncope and sudden onset of nausea when headache started around 3pm this afternoon.  Pt states her vision "went pale" in her right eye.  Pt described right sided facial numbness felt similar to Novocain but states numbness is subsiding. Denies change in vision. Denies fever or vomiting. Does report hx of headaches including a severe headache on top of her head last night but states she took ibuprofen went to bed and wake up without any pain at all this morning. States she felt great this morning. Denies being evaluated by a neurologist for headaches. Denies hx of similar symptoms. Denies hx of HTN. Denies hx of recent head trauma.     Past Medical History  Diagnosis Date  . Chicken pox as a child  . Anxiety   . 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 Actinic keratosis   . Vitamin D deficiency 2013  . Vitamin D deficiency   . Fibrocystic breast   . IBS (irritable bowel syndrome) 04/20/2013  . Cervical cancer screening 04/24/2013    Has a history of a cervical polyp found on evaluation of some cellular changes, D&C revealed only the polyp LMP 3/14  . Other and unspecified hyperlipidemia 06/02/2013  . Preventative health care 06/02/2013   Past Surgical History  Procedure Laterality Date  . Basal cell removal  2012    left shoulder  . Cholecystectomy  11-12  . Tonsillectomy  as a child  . Cesarean section  89 and 89  . Breast surgery   1996 and 2012    left breast- benign  . Axillary lymph node biopsy  80's    left  . Hemorrhoid surgery  1997   Family History  Problem Relation Age of Onset  . Hypertension Mother   . Cancer Mother 47    non hodgkin lymphoma, basal, scc  . Other Mother     bilateral renal stenosis  . Cancer Father     pituitary adenoma, basal  . Stroke Father 13  . Aneurysm Father 62    brain  . Cancer Brother     basal   . Hypertension Maternal Grandmother   . Heart attack Maternal Grandmother   . Other Maternal Grandmother     ras  . Heart attack Maternal Grandfather   . Stroke Paternal Grandmother   . Emphysema Paternal Grandfather     smoker  . COPD Paternal Grandfather     smoker  . Other Maternal Aunt     RAS/RAS  . Hypertension Maternal Aunt   . Colon cancer Neg Hx    History  Substance Use Topics  . Smoking status: Never Smoker   . Smokeless tobacco: Never Used  . Alcohol Use: 2.4 oz/week    4 Glasses of wine per week   OB History   Grav Para Term Preterm Abortions TAB SAB Ect Mult Living  Review of Systems  Constitutional: Negative for fever and chills.  Respiratory: Negative for cough and shortness of breath.   Cardiovascular: Negative for chest pain and palpitations.  Gastrointestinal: Positive for nausea ( intermittent). Negative for vomiting and abdominal pain.  Neurological: Positive for syncope ( "near syncope"), numbness and headaches. Negative for dizziness and weakness.  All other systems reviewed and are negative.     Allergies  Avelox; Penicillins; Codeine; Macrodantin; and Aspirin  Home Medications   Prior to Admission medications   Medication Sig Start Date End Date Taking? Authorizing Provider  ALPRAZolam Duanne Moron) 0.5 MG tablet Take 0.25-0.5 mg by mouth daily as needed for anxiety. 04/20/13  Yes Mosie Lukes, MD  levothyroxine (SYNTHROID, LEVOTHROID) 25 MCG tablet Take 25 mcg by mouth daily before breakfast.   Yes Historical  Provider, MD  Multiple Vitamin (MULTIVITAMIN WITH MINERALS) TABS tablet Take 1 tablet by mouth daily.   Yes Historical Provider, MD  OVER THE COUNTER MEDICATION Apply 1 application topically 3 (three) times a week. OTC progesterone cream.   Yes Historical Provider, MD  tretinoin (RETIN-A) 0.05 % cream Apply topically at bedtime. 04/26/13  Yes Mosie Lukes, MD   BP 108/52  Pulse 60  Temp(Src) 98.3 F (36.8 C) (Oral)  Resp 20  SpO2 99% Physical Exam  Nursing note and vitals reviewed. Constitutional: She is oriented to person, place, and time. She appears well-developed and well-nourished. No distress.  Pt lying comfortably in exam bed, NAD.   HENT:  Head: Normocephalic and atraumatic.  Eyes: Conjunctivae are normal. No scleral icterus.  Neck: Normal range of motion. Neck supple.  No nuchal rigidity or meningeal signs  Cardiovascular: Normal rate, regular rhythm and normal heart sounds.   Pulmonary/Chest: Effort normal and breath sounds normal. No respiratory distress. She has no wheezes. She has no rales. She exhibits no tenderness.  Abdominal: Soft. Bowel sounds are normal. She exhibits no distension and no mass. There is no tenderness. There is no rebound and no guarding.  Musculoskeletal: Normal range of motion.  Neurological: She is alert and oriented to person, place, and time. She has normal strength. No sensory deficit. She displays a negative Romberg sign. Coordination and gait normal. GCS eye subscore is 4. GCS verbal subscore is 5. GCS motor subscore is 6.  CN II-XII in tact, no focal deficit, nl finger to nose coordination. Nl sensation, 5/5 strength in all major muscle groups. Neg romberg and nl gait.  Skin: Skin is warm and dry. She is not diaphoretic.    ED Course  Procedures (including critical care time) Labs Review Labs Reviewed  BASIC METABOLIC PANEL - Abnormal; Notable for the following:    Potassium 3.5 (*)    GFR calc non Af Amer 67 (*)    GFR calc Af Amer 78  (*)    All other components within normal limits  CSF CELL COUNT WITH DIFFERENTIAL - Abnormal; Notable for the following:    RBC Count, CSF 63 (*)    All other components within normal limits  CSF CELL COUNT WITH DIFFERENTIAL - Abnormal; Notable for the following:    RBC Count, CSF 1 (*)    All other components within normal limits  GRAM STAIN  CSF CULTURE  CBC  PROTEIN, CSF  GLUCOSE, CSF    Imaging Review Ct Head Wo Contrast  07/28/2014   CLINICAL DATA:  Acute onset of headache with numbness in the right face and arm.  EXAM: CT HEAD WITHOUT CONTRAST  TECHNIQUE:  Contiguous axial images were obtained from the base of the skull through the vertex without intravenous contrast.  COMPARISON:  None.  FINDINGS: The brain appears normal without hemorrhage, infarct, mass lesion, mass effect, midline shift or abnormal extra-axial fluid collection. There is no hydrocephalus or pneumocephalus. The calvarium is intact. Imaged paranasal sinuses and mastoid air cells are clear.  IMPRESSION: Negative head CT.   Electronically Signed   By: Inge Rise M.D.   On: 07/28/2014 17:34     EKG Interpretation   Date/Time:  Friday July 28 2014 16:04:23 EDT Ventricular Rate:  62 PR Interval:  143 QRS Duration: 67 QT Interval:  405 QTC Calculation: 411 R Axis:   80 Text Interpretation:  Sinus rhythm Consider left atrial enlargement No old  tracing to compare Confirmed by KNAPP  MD-J, JON ([id number]) on 07/28/2014  4:12:23 PM      MDM   Final diagnoses:  Acute nonintractable headache, unspecified headache type  Numbness and tingling of right side of face  Right arm numbness  Nausea  Near syncope    Pt is a 53yo female presenting to ED with c/o sudden onset sharp head pain with associated near syncope and right sided facial and arm numbness that has gradually been resolving since onset around 3PM this afternoon.  Declined pain or nausea medication.  High suspicion for [facility] due to sudden onset of  symptoms.  CT head: negative for acute findings.  Discussed with Dr. Colin Rhein who also examined pt. LP was performed w/o immediate complications. Labs: unremarkable, no evidence of SAH. No evidence of emergent process taking place at this time.  Will discharge pt home to f/u with PCP. Return precautions provided. Pt verbalized understanding and agreement with tx plan.    Noland Fordyce, PA-C 07/28/14 2229

## 2014-07-28 NOTE — ED Provider Notes (Signed)
LUMBAR PUNCTURE Date/Time: 07/28/2014 8:31 PM Performed by: Debby Freiberg Authorized by: Debby Freiberg Consent: written consent obtained. Risks and benefits: risks, benefits and alternatives were discussed Patient understanding: patient states understanding of the procedure being performed Patient consent: the patient's understanding of the procedure matches consent given Procedure consent: procedure consent matches procedure scheduled Relevant documents: relevant documents present and verified Test results: test results available and properly labeled Imaging studies: imaging studies available Indications: evaluation for subarachnoid hemorrhage Anesthesia: local infiltration Local anesthetic: lidocaine 1% with epinephrine Lumbar space: L3-L4 interspace Patient's position: right lateral decubitus Needle gauge: 20 Needle type: spinal needle - Quincke tip Needle length: 3.5 in Number of attempts: 1 Fluid appearance: clear Tubes of fluid: 4 Total volume: 4 ml Post-procedure: site cleaned and adhesive bandage applied Patient tolerance: Patient tolerated the procedure well with no immediate complications.  Briefly, pt is a 53 y.o. female with ho migraine presenting with worse, sudden onset ha with R sided paresthesias.  On assumption of care, pt has minimal ha.  History concerning for possible SAH.  CT scan unremarkable.  LP performed as above.  Unremarkable studies.  HA improved.  DC home to fu with neurology. Medical screening examination/treatment/procedure(s) were conducted as a shared visit with non-physician practitioner(s) and myself.  I personally evaluated the patient during the encounter.   EKG Interpretation   Date/Time:  Friday July 28 2014 16:04:23 EDT Ventricular Rate:  62 PR Interval:  143 QRS Duration: 67 QT Interval:  405 QTC Calculation: 411 R Axis:   80 Text Interpretation:  Sinus rhythm Consider left atrial enlargement No old  tracing to compare Confirmed  by KNAPP  MD-J, JON [phone]) on 07/28/2014  4:12:23 PM        Debby Freiberg, MD 07/29/14 1254

## 2014-07-28 NOTE — ED Notes (Addendum)
Pt was shopping, had sudden onset sharp pain to the top of her head, then numbness to right side of face and R arm. Pt adds that she had migraine headache last pm and took meds, felt fine upon waking. Pt states that vision "went pale" in R eye.  Pt adds that with numbness, she had sudden onset nausea and felt like she was going to pass out but never has LOC. Pt has no neurological deficits, exhibits normal facial symmetry, with strong bilateral upper extremities. Pt is A&O x4 and in NAD.

## 2014-07-28 NOTE — ED Notes (Signed)
Labs to be obtained with IV start per RN

## 2014-07-29 LAB — GRAM STAIN: GRAM STAIN: NONE SEEN

## 2014-08-01 LAB — CSF CULTURE W GRAM STAIN
Culture: NO GROWTH
Gram Stain: NONE SEEN

## 2014-08-01 LAB — CSF CULTURE

## 2014-08-04 ENCOUNTER — Encounter: Payer: Self-pay | Admitting: Neurology

## 2014-08-04 ENCOUNTER — Ambulatory Visit (INDEPENDENT_AMBULATORY_CARE_PROVIDER_SITE_OTHER): Payer: 59 | Admitting: Neurology

## 2014-08-04 VITALS — BP 116/69 | HR 67 | Ht 65.0 in | Wt 147.0 lb

## 2014-08-04 DIAGNOSIS — Z823 Family history of stroke: Secondary | ICD-10-CM

## 2014-08-04 DIAGNOSIS — R209 Unspecified disturbances of skin sensation: Secondary | ICD-10-CM

## 2014-08-04 DIAGNOSIS — G43809 Other migraine, not intractable, without status migrainosus: Secondary | ICD-10-CM | POA: Insufficient documentation

## 2014-08-04 DIAGNOSIS — Z8249 Family history of ischemic heart disease and other diseases of the circulatory system: Secondary | ICD-10-CM

## 2014-08-04 NOTE — Patient Instructions (Addendum)
I had a long discussion with the patient with regards to her episode of transient headache and right face and arm paresthesias most likely representing a complicated migraine episode, personally reviewed imaging films, lab results and answered questions. . Given family history of brain  aneurysm recommend checking an MRI scan of the brain and MRA of the brain. I encouraged her to increase participation in activities for stress relaxation as stress appears to base a prominent trigger for her migraines. The headache frequency is not enough to justify prophylactic medications at the current time. She may consider starting aspirin 81 mg daily for primary stroke/cardiac prevention.She'll return for followup in the future as necessary .Migraine Headache A migraine headache is an intense, throbbing pain on one or both sides of your head. A migraine can last for 30 minutes to several hours. CAUSES  The exact cause of a migraine headache is not always known. However, a migraine may be caused when nerves in the brain become irritated and release chemicals that cause inflammation. This causes pain. Certain things may also trigger migraines, such as:  Alcohol.  Smoking.  Stress.  Menstruation.  Aged cheeses.  Foods or drinks that contain nitrates, glutamate, aspartame, or tyramine.  Lack of sleep.  Chocolate.  Caffeine.  Hunger.  Physical exertion.  Fatigue.  Medicines used to treat chest pain (nitroglycerine), birth control pills, estrogen, and some blood pressure medicines. SIGNS AND SYMPTOMS  Pain on one or both sides of your head.  Pulsating or throbbing pain.  Severe pain that prevents daily activities.  Pain that is aggravated by any physical activity.  Nausea, vomiting, or both.  Dizziness.  Pain with exposure to bright lights, loud noises, or activity.  General sensitivity to bright lights, loud noises, or smells. Before you get a migraine, you may get warning signs that  a migraine is coming (aura). An aura may include:  Seeing flashing lights.  Seeing bright spots, halos, or zigzag lines.  Having tunnel vision or blurred vision.  Having feelings of numbness or tingling.  Having trouble talking.  Having muscle weakness. DIAGNOSIS  A migraine headache is often diagnosed based on:  Symptoms.  Physical exam.  A CT scan or MRI of your head. These imaging tests cannot diagnose migraines, but they can help rule out other causes of headaches. TREATMENT Medicines may be given for pain and nausea. Medicines can also be given to help prevent recurrent migraines.  HOME CARE INSTRUCTIONS  Only take over-the-counter or prescription medicines for pain or discomfort as directed by your health care provider. The use of long-term narcotics is not recommended.  Lie down in a dark, quiet room when you have a migraine.  Keep a journal to find out what may trigger your migraine headaches. For example, write down:  What you eat and drink.  How much sleep you get.  Any change to your diet or medicines.  Limit alcohol consumption.  Quit smoking if you smoke.  Get 7-9 hours of sleep, or as recommended by your health care provider.  Limit stress.  Keep lights dim if bright lights bother you and make your migraines worse. SEEK IMMEDIATE MEDICAL CARE IF:   Your migraine becomes severe.  You have a fever.  You have a stiff neck.  You have vision loss.  You have muscular weakness or loss of muscle control.  You start losing your balance or have trouble walking.  You feel faint or pass out.  You have severe symptoms that are different from  your first symptoms. MAKE SURE YOU:   Understand these instructions.  Will watch your condition.  Will get help right away if you are not doing well or get worse. Document Released: 12/01/2005 Document Revised: 04/17/2014 Document Reviewed: 08/08/2013 [facility] Patient Information 2015 [place], [place]. This  information is not intended to replace advice given to you by your health care provider. Make sure you discuss any questions you have with your health care provider.

## 2014-08-04 NOTE — Progress Notes (Signed)
[facility] [address] [place] [address] [phone]       OFFICE CONSULT NOTE  [name] Date of Birth:  [birth year] Medical Record Number:  [id number]   Referring MD: Dr Debby Freiberg  Reason for Referral:  Headache and numbness episode  HPI: 30 year pleasant Caucasian lady in who on 07/28/14 developed sudden onset of sharp pain on the top of her head while shopping with her son who is going to college. The severe pain lasted only a few minutes but was followed by dull ache which persisted. She subsequently 5 minutes later noticed some numbness involving the right side of the face as well as inside of the cheeks. About 5-10 minutes later this spread to involve the right forearm and hand. She also felt her vision went pale for a few seconds in the right eye when the symptoms began. Upon arrival at the emergency room her headache was practically gone but she still had some numbness which subsequently resolved over about an hour. She describes a feeling of novocaine on the right side of the cheeks. She had a noncontrast CT scan of the head which I personally reviewed and is unremarkable. She also had a spinal tap which showed 63 red cells but with evidence of traumatic tap. There were no white cells and CSF protein and glucose are normal. She was found to have no focal deficits on exam. She was discharged home with instructions to followup as an outpatient with neurology. Her basic metabolic panel labs and CBC were normal. She states that she felt tired and weak the whole day. Subsequently she still had some minor headaches particularly after she's been in the upright position but these appear to be getting better. She has a lifelong history of migraine headaches but over the years had noticed that the headaches are less frequent. About 5 years ago she had transient episode of visual dysfunction seems exact lines and patent which was probably her ocular migraine.  She has never had numbness accompanying any of her prior migraines. She admits that she did have what appeared to be a migraine headache the night prior to presentation but she took some ibuprofen and went to sleep next day woke up without any headache. She has no history of strokes, [facility] hospital with neurological problems. She has family history of brain aneurysm in her father who also had pituitary tumor and had the unfortunate stroke following surgery for that. She does have mild chronic anxiety and takes Xanax. She admits to being under increased stress recently due to the her son moving to college as well as the health of her parents and she herself starting a new job. She does not smoke or take birth control pills.  ROS:   14 system review of systems is positive for  headache, numbness and anxiety only and all other systems negative  PMH:  Past Medical History  Diagnosis Date  . Chicken pox as a child  . Anxiety   . 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 Actinic keratosis   . Vitamin D deficiency 2013  . Vitamin D deficiency   . Fibrocystic breast   . IBS (irritable bowel syndrome) 04/20/2013  . Cervical cancer screening 04/24/2013    Has a history of a cervical polyp found on evaluation of some cellular changes, D&C revealed only the polyp LMP 3/14  . Other and unspecified hyperlipidemia 06/02/2013  . Preventative health care 06/02/2013  Social History:  History   Social History  . Marital Status: Married    Spouse Name: N/A    Number of Children: 2  . Years of Education: master's   Occupational History  . [employer]    Social History Main Topics  . Smoking status: Never Smoker   . Smokeless tobacco: Never Used  . Alcohol Use: 2.4 oz/week    4 Glasses of wine per week  . Drug Use: No  . Sexual Activity: Yes    Partners: Male   Other Topics Concern  . Not on file   Social History Narrative  . No narrative on file    Medications:   Current Outpatient  Prescriptions on File Prior to Visit  Medication Sig Dispense Refill  . ALPRAZolam (XANAX) 0.5 MG tablet Take 0.25-0.5 mg by mouth daily as needed for anxiety.      Marland Kitchen levothyroxine (SYNTHROID, LEVOTHROID) 25 MCG tablet Take 25 mcg by mouth daily before breakfast.      . Multiple Vitamin (MULTIVITAMIN WITH MINERALS) TABS tablet Take 1 tablet by mouth daily.      Marland Kitchen OVER THE COUNTER MEDICATION Apply 1 application topically 3 (three) times a week. OTC progesterone cream.      . tretinoin (RETIN-A) 0.05 % cream Apply topically at bedtime.  45 g  1   No current facility-administered medications on file prior to visit.    Allergies:   Allergies  Allergen Reactions  . Avelox [Moxifloxacin Hcl In Nacl] Anaphylaxis  . Penicillins Anaphylaxis  . Codeine     GI upset  . Macrodantin [Nitrofurantoin Macrocrystal]     Burning throat  . Aspirin Diarrhea, Nausea Only and Anxiety    Physical Exam General: well developed, well nourished, seated, in no evident distress Head: head normocephalic and atraumatic. Orohparynx benign Neck: supple with no carotid or supraclavicular bruits Cardiovascular: regular rate and rhythm, no murmurs Musculoskeletal: no deformity Skin:  no rash/petichiae Vascular:  Normal pulses all extremities Filed Vitals:   08/04/14 1004  BP: 116/69  Pulse: 67    Neurologic Exam Mental Status: Awake and fully alert. Oriented to place and time. Recent and remote memory intact. Attention span, concentration and fund of knowledge appropriate. Mood and affect appropriate.  Cranial Nerves: Fundoscopic exam reveals sharp disc margins. Pupils equal, briskly reactive to light. Extraocular movements full without nystagmus. Visual fields full to confrontation. Hearing intact. Facial sensation intact. Face, tongue, palate moves normally and symmetrically.  Motor: Normal bulk and tone. Normal strength in all tested extremity muscles. Sensory.: intact to touch and pinprick , position and  vibratory sensation.  Coordination: Rapid alternating movements normal in all extremities. Finger-to-nose and heel-to-shin performed accurately bilaterally. Gait and Station: Arises from chair without difficulty. Stance is normal. Gait demonstrates normal stride length and balance . Able to heel, toe and tandem walk without difficulty.  Reflexes: 1+ and symmetric. Toes downgoing.    ASSESSMENT: 65 year Caucasian lady with transient episode of headache followed subsequently by flitting right face and arm paresthesias probably complicated migraine episode. Family history of brain aneurysm. No significant vascular risk factors    PLAN: I had a long discussion with the patient with regards to her episode of transient headache and right face and arm paresthesias most likely representing a complicated migraine episode, personally reviewed imaging films, lab results and answered questions. . Given family history of brain  aneurysm recommend checking an MRI scan of the brain and MRA of the brain. I encouraged her to  increase participation in activities for stress relaxation as stress appears to base a prominent trigger for her migraines. The headache frequency is not enough to justify prophylactic medications at the current time. She may consider starting aspirin 81 mg daily for primary stroke/cardiac prevention.She'll return for followup in the future as necessary.  Note: This document was prepared with digital dictation and possible smart phrase technology. Any transcriptional errors that result from this process are unintentional.

## 2014-08-10 ENCOUNTER — Encounter: Payer: 59 | Admitting: Family Medicine

## 2014-08-11 DIAGNOSIS — R51 Headache: Secondary | ICD-10-CM

## 2014-08-14 ENCOUNTER — Telehealth: Payer: Self-pay | Admitting: Neurology

## 2014-08-14 ENCOUNTER — Other Ambulatory Visit: Payer: Self-pay | Admitting: Neurology

## 2014-08-14 DIAGNOSIS — R51 Headache: Secondary | ICD-10-CM

## 2014-08-14 DIAGNOSIS — R209 Unspecified disturbances of skin sensation: Secondary | ICD-10-CM

## 2014-08-14 DIAGNOSIS — G43809 Other migraine, not intractable, without status migrainosus: Secondary | ICD-10-CM

## 2014-08-14 DIAGNOSIS — Z8249 Family history of ischemic heart disease and other diseases of the circulatory system: Secondary | ICD-10-CM

## 2014-08-22 ENCOUNTER — Other Ambulatory Visit: Payer: Self-pay | Admitting: Family Medicine

## 2014-08-22 NOTE — Telephone Encounter (Signed)
15 tabs sent to pharmacy - due to last two appts being cancelled.  Will send more if patient comes to appt tomorrow

## 2014-08-23 ENCOUNTER — Encounter: Payer: Self-pay | Admitting: Family Medicine

## 2014-08-23 ENCOUNTER — Ambulatory Visit (INDEPENDENT_AMBULATORY_CARE_PROVIDER_SITE_OTHER): Payer: 59 | Admitting: Family Medicine

## 2014-08-23 VITALS — BP 120/77 | HR 63 | Temp 98.7°F | Ht 65.0 in | Wt 145.4 lb

## 2014-08-23 DIAGNOSIS — Z0000 Encounter for general adult medical examination without abnormal findings: Secondary | ICD-10-CM

## 2014-08-23 DIAGNOSIS — F411 Generalized anxiety disorder: Secondary | ICD-10-CM

## 2014-08-23 DIAGNOSIS — F419 Anxiety disorder, unspecified: Secondary | ICD-10-CM

## 2014-08-23 DIAGNOSIS — E039 Hypothyroidism, unspecified: Secondary | ICD-10-CM

## 2014-08-23 DIAGNOSIS — E785 Hyperlipidemia, unspecified: Secondary | ICD-10-CM

## 2014-08-23 DIAGNOSIS — E559 Vitamin D deficiency, unspecified: Secondary | ICD-10-CM

## 2014-08-23 DIAGNOSIS — Z23 Encounter for immunization: Secondary | ICD-10-CM

## 2014-08-23 MED ORDER — ALPRAZOLAM 0.5 MG PO TABS: 0.2500 mg | ORAL_TABLET | Freq: Every day | ORAL | Status: DC | PRN

## 2014-08-23 NOTE — Progress Notes (Signed)
Pre visit review using our clinic review tool, if applicable. No additional management support is needed unless otherwise documented below in the visit note. 

## 2014-08-23 NOTE — Patient Instructions (Signed)
Vitamin D prior to next years annual  Also need in 1 months labs at [facility], lipid, renal, cbc, hepatic, tsh, urinalysis  Migraine Headache A migraine headache is an intense, throbbing pain on one or both sides of your head. A migraine can last for 30 minutes to several hours. CAUSES  The exact cause of a migraine headache is not always known. However, a migraine may be caused when nerves in the brain become irritated and release chemicals that cause inflammation. This causes pain. Certain things may also trigger migraines, such as:  Alcohol.  Smoking.  Stress.  Menstruation.  Aged cheeses.  Foods or drinks that contain nitrates, glutamate, aspartame, or tyramine.  Lack of sleep.  Chocolate.  Caffeine.  Hunger.  Physical exertion.  Fatigue.  Medicines used to treat chest pain (nitroglycerine), birth control pills, estrogen, and some blood pressure medicines. SIGNS AND SYMPTOMS  Pain on one or both sides of your head.  Pulsating or throbbing pain.  Severe pain that prevents daily activities.  Pain that is aggravated by any physical activity.  Nausea, vomiting, or both.  Dizziness.  Pain with exposure to bright lights, loud noises, or activity.  General sensitivity to bright lights, loud noises, or smells. Before you get a migraine, you may get warning signs that a migraine is coming (aura). An aura may include:  Seeing flashing lights.  Seeing bright spots, halos, or zigzag lines.  Having tunnel vision or blurred vision.  Having feelings of numbness or tingling.  Having trouble talking.  Having muscle weakness. DIAGNOSIS  A migraine headache is often diagnosed based on:  Symptoms.  Physical exam.  A CT scan or MRI of your head. These imaging tests cannot diagnose migraines, but they can help rule out other causes of headaches. TREATMENT Medicines may be given for pain and nausea. Medicines can also be given to help prevent recurrent migraines.    HOME CARE INSTRUCTIONS  Only take over-the-counter or prescription medicines for pain or discomfort as directed by your health care provider. The use of long-term narcotics is not recommended.  Lie down in a dark, quiet room when you have a migraine.  Keep a journal to find out what may trigger your migraine headaches. For example, write down:  What you eat and drink.  How much sleep you get.  Any change to your diet or medicines.  Limit alcohol consumption.  Quit smoking if you smoke.  Get 7-9 hours of sleep, or as recommended by your health care provider.  Limit stress.  Keep lights dim if bright lights bother you and make your migraines worse. SEEK IMMEDIATE MEDICAL CARE IF:   Your migraine becomes severe.  You have a fever.  You have a stiff neck.  You have vision loss.  You have muscular weakness or loss of muscle control.  You start losing your balance or have trouble walking.  You feel faint or pass out.  You have severe symptoms that are different from your first symptoms. MAKE SURE YOU:   Understand these instructions.  Will watch your condition.  Will get help right away if you are not doing well or get worse. Document Released: 12/01/2005 Document Revised: 04/17/2014 Document Reviewed: 08/08/2013 [facility] Patient Information 2015 [place], [place]. This information is not intended to replace advice given to you by your health care provider. Make sure you discuss any questions you have with your health care provider.

## 2014-08-23 NOTE — Progress Notes (Signed)
Patient ID: Toni Boone, female   DOB: December 10, 1961, 53 y.o.   MRN: [id number] Toni Boone [id number] 1961-08-04 08/23/2014      Progress Note-Follow Up  Subjective  Chief Complaint  Chief Complaint  Patient presents with  . Annual Exam    physical  . Injections    flu    HPI  Patient is a 53 year old female in today for routine medical care. She is in today for annual exam. She is generally feeling well. She did have a very severe headache with nausea and right-sided facial and arm numbness recently and was seen in the emergency room. She is to follow up with neurology and no diagnosis other than migraine neurologic symptoms have been made. No headaches. Otherwise she feels fairly well. No recent illness or fever. Denies CP/palp/SOB/HA/congestion/fevers/GI or GU c/o. Taking meds as prescribed  Past Medical History  Diagnosis Date  . Chicken pox as a child  . Anxiety   . 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 Actinic keratosis   . Vitamin D deficiency 2013  . Vitamin D deficiency   . Fibrocystic breast   . IBS (irritable bowel syndrome) 04/20/2013  . Cervical cancer screening 04/24/2013    Has a history of a cervical polyp found on evaluation of some cellular changes, D&C revealed only the polyp LMP 3/14  . Other and unspecified hyperlipidemia 06/02/2013  . Preventative health care 06/02/2013    Past Surgical History  Procedure Laterality Date  . Basal cell removal  2012    left shoulder  . Cholecystectomy  11-12  . Tonsillectomy  as a child  . Cesarean section  89 and 89  . Breast surgery  1996 and 2012    left breast- benign  . Axillary lymph node biopsy  80's    left  . Hemorrhoid surgery  1997    Family History  Problem Relation Age of Onset  . Hypertension Mother   . Cancer Mother 98    non hodgkin lymphoma, basal, scc  . Other Mother     bilateral renal stenosis  . Cancer Father     pituitary adenoma, basal  . Stroke Father 35  . Aneurysm Father 18     brain  . Cancer Brother     basal   . Hypertension Maternal Grandmother   . Heart attack Maternal Grandmother   . Other Maternal Grandmother     ras  . Heart attack Maternal Grandfather   . Stroke Paternal Grandmother   . Emphysema Paternal Grandfather     smoker  . COPD Paternal Grandfather     smoker  . Other Maternal Aunt     RAS/RAS  . Hypertension Maternal Aunt   . Colon cancer Neg Hx     History   Social History  . Marital Status: Married    Spouse Name: N/A    Number of Children: 2  . Years of Education: master's   Occupational History  . [employer]    Social History Main Topics  . Smoking status: Never Smoker   . Smokeless tobacco: Never Used  . Alcohol Use: 2.4 oz/week    4 Glasses of wine per week  . Drug Use: No  . Sexual Activity: Yes    Partners: Male     Comment: lives with husband, no dietary restrictions, works with [employer]   Other Topics Concern  . Not on file   Social History Narrative  .  No narrative on file    Current Outpatient Prescriptions on File Prior to Visit  Medication Sig Dispense Refill  . ALPRAZolam (XANAX) 0.5 MG tablet Take 0.25-0.5 mg by mouth daily as needed for anxiety.      Marland Kitchen levothyroxine (SYNTHROID, LEVOTHROID) 25 MCG tablet TAKE 1 TABLET BY MOUTH DAILY BEFORE BREAKFAST.  15 tablet  0  . Multiple Vitamin (MULTIVITAMIN WITH MINERALS) TABS tablet Take 1 tablet by mouth daily.      Marland Kitchen OVER THE COUNTER MEDICATION Apply 1 application topically 3 (three) times a week. OTC progesterone cream.      . tretinoin (RETIN-A) 0.05 % cream Apply topically at bedtime.  45 g  1   No current facility-administered medications on file prior to visit.    Allergies  Allergen Reactions  . Avelox [Moxifloxacin Hcl In Nacl] Anaphylaxis  . Penicillins Anaphylaxis  . Codeine     GI upset  . Macrodantin [Nitrofurantoin Macrocrystal]     Burning throat  . Aspirin Diarrhea, Nausea Only and Anxiety    Review of Systems  Review of Systems   Constitutional: Negative for fever, chills and malaise/fatigue.  HENT: Negative for congestion, hearing loss and nosebleeds.   Eyes: Negative for discharge.  Respiratory: Negative for cough, sputum production, shortness of breath and wheezing.   Cardiovascular: Negative for chest pain, palpitations and leg swelling.  Gastrointestinal: Negative for heartburn, nausea, vomiting, abdominal pain, diarrhea, constipation and blood in stool.  Genitourinary: Negative for dysuria, urgency, frequency and hematuria.  Musculoskeletal: Negative for back pain, falls and myalgias.  Skin: Negative for rash.  Neurological: Negative for dizziness, tremors, sensory change, focal weakness, loss of consciousness, weakness and headaches.  Endo/Heme/Allergies: Negative for polydipsia. Does not bruise/bleed easily.  Psychiatric/Behavioral: Negative for depression and suicidal ideas. The patient is not nervous/anxious and does not have insomnia.     Objective  BP 120/77  Pulse 63  Temp(Src) 98.7 F (37.1 C) (Oral)  Ht 5\' 5"  (1.651 m)  Wt 145 lb 6.4 oz (65.953 kg)  BMI 24.20 kg/m2  SpO2 98%  Physical Exam  Physical Exam  Constitutional: She is oriented to person, place, and time and well-developed, well-nourished, and in no distress. No distress.  HENT:  Head: Normocephalic and atraumatic.  Right Ear: External ear normal.  Left Ear: External ear normal.  Nose: Nose normal.  Mouth/Throat: Oropharynx is clear and moist. No oropharyngeal exudate.  Eyes: Conjunctivae are normal. Pupils are equal, round, and reactive to light. Right eye exhibits no discharge. Left eye exhibits no discharge. No scleral icterus.  Neck: Normal range of motion. Neck supple. No thyromegaly present.  Cardiovascular: Normal rate, regular rhythm, normal heart sounds and intact distal pulses.   No murmur heard. Pulmonary/Chest: Effort normal and breath sounds normal. No respiratory distress. She has no wheezes. She has no rales.   Abdominal: Soft. Bowel sounds are normal. She exhibits no distension and no mass. There is no tenderness.  Musculoskeletal: Normal range of motion. She exhibits no edema and no tenderness.  Lymphadenopathy:    She has no cervical adenopathy.  Neurological: She is alert and oriented to person, place, and time. She has normal reflexes. No cranial nerve deficit. Coordination normal.  Skin: Skin is warm and dry. No rash noted. She is not diaphoretic.  Psychiatric: Mood, memory and affect normal.    Lab Results  Component Value Date   TSH 3.18 11/03/2013   Lab Results  Component Value Date   WBC 7.3 07/28/2014   HGB  14.0 07/28/2014   HCT 40.4 07/28/2014   MCV 91.6 07/28/2014   PLT 266 07/28/2014   Lab Results  Component Value Date   CREATININE 0.95 07/28/2014   BUN 14 07/28/2014   NA 140 07/28/2014   K 3.5* 07/28/2014   CL 101 07/28/2014   CO2 25 07/28/2014   Lab Results  Component Value Date   ALT 17 04/21/2013   AST 19 04/21/2013   ALKPHOS 47 04/21/2013   BILITOT 0.5 04/21/2013   Lab Results  Component Value Date   CHOL 199 04/21/2013   Lab Results  Component Value Date   HDL 58 04/21/2013   Lab Results  Component Value Date   LDLCALC 130* 04/21/2013   Lab Results  Component Value Date   TRIG 54 04/21/2013   Lab Results  Component Value Date   CHOLHDL 3.4 04/21/2013     Assessment & Plan   Vitamin D deficiency Will check repeat level next week at patient discretion  Other and unspecified hyperlipidemia Encouraged heart healthy diet, increase exercise, avoid trans fats, consider a krill oil cap daily  Preventative health care Patient encouraged to maintain heart healthy diet, regular exercise, adequate sleep. Consider daily probiotics. Take medications as prescribed  Anxiety Allowed refill on Alprazolam to use sparingly  Unspecified hypothyroidism On Levothyroxine, continue to monitor

## 2014-08-26 ENCOUNTER — Encounter: Payer: Self-pay | Admitting: Family Medicine

## 2014-08-26 DIAGNOSIS — E039 Hypothyroidism, unspecified: Secondary | ICD-10-CM

## 2014-08-26 HISTORY — DX: Hypothyroidism, unspecified: E03.9

## 2014-08-26 NOTE — Assessment & Plan Note (Signed)
Patient encouraged to maintain heart healthy diet, regular exercise, adequate sleep. Consider daily probiotics. Take medications as prescribed 

## 2014-08-26 NOTE — Assessment & Plan Note (Signed)
Allowed refill on Alprazolam to use sparingly

## 2014-08-26 NOTE — Assessment & Plan Note (Signed)
Encouraged heart healthy diet, increase exercise, avoid trans fats, consider a krill oil cap daily 

## 2014-08-26 NOTE — Assessment & Plan Note (Signed)
Will check repeat level next week at patient discretion

## 2014-08-26 NOTE — Assessment & Plan Note (Signed)
On Levothyroxine, continue to monitor 

## 2014-09-13 ENCOUNTER — Other Ambulatory Visit: Payer: Self-pay | Admitting: Family Medicine

## 2014-09-28 ENCOUNTER — Other Ambulatory Visit: Payer: Self-pay

## 2014-09-28 DIAGNOSIS — Z1239 Encounter for other screening for malignant neoplasm of breast: Secondary | ICD-10-CM

## 2014-09-29 ENCOUNTER — Other Ambulatory Visit: Payer: Self-pay

## 2014-09-29 ENCOUNTER — Other Ambulatory Visit (INDEPENDENT_AMBULATORY_CARE_PROVIDER_SITE_OTHER): Payer: 59

## 2014-09-29 DIAGNOSIS — R946 Abnormal results of thyroid function studies: Secondary | ICD-10-CM

## 2014-09-29 DIAGNOSIS — E7800 Pure hypercholesterolemia, unspecified: Secondary | ICD-10-CM

## 2014-09-29 DIAGNOSIS — Z0001 Encounter for general adult medical examination with abnormal findings: Secondary | ICD-10-CM

## 2014-09-29 DIAGNOSIS — Z0000 Encounter for general adult medical examination without abnormal findings: Secondary | ICD-10-CM

## 2014-09-29 DIAGNOSIS — R6889 Other general symptoms and signs: Secondary | ICD-10-CM

## 2014-09-29 LAB — COMPREHENSIVE METABOLIC PANEL
ALK PHOS: 52 U/L (ref 39–117)
ALT: 26 U/L (ref 0–35)
AST: 25 U/L (ref 0–37)
Albumin: 3.9 g/dL (ref 3.5–5.2)
BILIRUBIN TOTAL: 0.7 mg/dL (ref 0.2–1.2)
BUN: 13 mg/dL (ref 6–23)
CO2: 28 mEq/L (ref 19–32)
CREATININE: 0.9 mg/dL (ref 0.4–1.2)
Calcium: 9.6 mg/dL (ref 8.4–10.5)
Chloride: 103 mEq/L (ref 96–112)
GFR: 69.54 mL/min (ref >60.00)
Glucose, Bld: 97 mg/dL (ref 70–99)
Potassium: 4.3 mEq/L (ref 3.5–5.1)
Sodium: 138 mEq/L (ref 135–145)
TOTAL PROTEIN: 7.8 g/dL (ref 6.0–8.3)

## 2014-09-29 LAB — CBC
HEMATOCRIT: 39.9 % (ref 36.0–46.0)
HEMOGLOBIN: 13.4 g/dL (ref 12.0–15.0)
MCHC: 33.5 g/dL (ref 30.0–36.0)
MCV: 93.3 fl (ref 78.0–100.0)
Platelets: 221 10*3/uL (ref 150.0–400.0)
RBC: 4.28 Mil/uL (ref 3.87–5.11)
RDW: 12.8 % (ref 11.5–15.5)
WBC: 5.7 10*3/uL (ref 4.0–10.5)

## 2014-09-29 LAB — TSH: TSH: 6.31 u[IU]/mL — AB (ref 0.35–4.50)

## 2014-10-02 ENCOUNTER — Other Ambulatory Visit: Payer: Self-pay | Admitting: Family Medicine

## 2014-10-02 LAB — LIPID PANEL
CHOLESTEROL: 230 mg/dL — AB (ref 0–200)
HDL: 46.7 mg/dL (ref >39.00)
LDL Cholesterol: 164 mg/dL — ABNORMAL HIGH (ref 0–99)
NonHDL: 183.3
Total CHOL/HDL Ratio: 5
Triglycerides: 99 mg/dL (ref 0.0–149.0)
VLDL: 19.8 mg/dL (ref 0.0–40.0)

## 2014-10-02 LAB — T4, FREE: Free T4: 0.85 ng/dL (ref 0.60–1.60)

## 2014-10-03 MED ORDER — LEVOTHYROXINE SODIUM 25 MCG PO TABS: 25.0000 ug | ORAL_TABLET | Freq: Every day | ORAL | Status: DC

## 2014-10-03 NOTE — Telephone Encounter (Signed)
Please advise if you want patient to stay on 25?

## 2014-10-20 ENCOUNTER — Ambulatory Visit: Payer: Self-pay

## 2014-11-02 ENCOUNTER — Ambulatory Visit
Admission: RE | Admit: 2014-11-02 | Discharge: 2014-11-02 | Disposition: A | Discharge status: Home / Self Care | Payer: 59 | Source: Ambulatory Visit

## 2014-11-02 DIAGNOSIS — Z1239 Encounter for other screening for malignant neoplasm of breast: Secondary | ICD-10-CM

## 2015-03-16 ENCOUNTER — Telehealth: Payer: Self-pay | Admitting: Family Medicine

## 2015-03-16 ENCOUNTER — Other Ambulatory Visit: Payer: Self-pay | Admitting: *Deleted

## 2015-03-16 DIAGNOSIS — R002 Palpitations: Secondary | ICD-10-CM

## 2015-03-16 NOTE — Telephone Encounter (Signed)
[facility] - Client [place]    --------------------------------------------------------------------------------   Patient Name: Toni Boone  Gender: Female  DOB: 08/20/1961   Age: 54 Y 46 M 19 D  Return Phone Number: [phone] (Primary), [phone] (Secondary)  Address: [address]    City/State/Zip: [place]  [address]   Client [facility] - Client  Client Site [facility]  Physician Penni Homans   Contact Type Call  Call Type Triage / Clinical       Relationship To Patient Self  Appointment Disposition EMR Appointment Not Necessary  Info pasted into Epic Yes  Return Phone Number [phone] (Primary)  Chief Complaint Chest Pain (non urgent symptoms)  Initial Comment Caller States she gets chest pains when she exercises and feels like she will pass out. she is not feeling those symptoms at the moment.        PreDisposition Did not know what to do            Nurse Assessment  Nurse: Mechele Dawley, RN, Amy Date/Time Eilene Ghazi Time): 03/16/2015 10:15:32 AM  Confirm and document reason for call. If symptomatic, describe symptoms. ---CALLER STATES THAT SHE IS GETTING SOME CHEST PAINS WHEN SHE IS EXERCISING WHEN SHE IS EXERCISING. SHE FEELS LIKE SHE IS GOING TO PASS OUT. SHE STATES THAT SHE HAS BEEN HAVING THESE ISSUES FOR A MONTH NOW. SHE HAS NOT SEEN MD ABOUT THIS CONDITION. SHE STATES THIS ONLY HAPPENS WHEN SHE IS EXERCISING. SHE STATES IT SEEMS TO BE MORE AFTERWARDS OF HER EXERCISE. SHE EXERCISES FOR AN HOUR - 1.5 HOURS. SHE DOES THIS AT LEAST 3 TIMES A WEEK. SHE HAD STARTED EXERCISING IN DECEMBER CONSISTENTLY. NO CHANGES IN EXERCISE ROUTINE. SHE FEELS LIKE SHE IS GOING TO PASS OUT. SHE STATES THAT SHE HAS TO SIT DOWN AND REST. IT USUALLY TAKES 20-30 MINUTES FOR IT TO PASS. IT DOES NOT COME BACK ON UNTIL THE NEXT TIME THAT SHE EXERCISES. SHE FEELS  PRESSURE ACROSS THE CHEST AND ALL OF THIS LAST FOR ABOUT 20-30 MINUTES AND IT TAKES THIS LONG FOR IT TO SUBSIDE.    Has the patient traveled out of the country within the last 30 days? ---Not Applicable    Does the patient require triage? ---Yes    Related visit to physician within the last 2 weeks? ---No    Does the PT have any chronic conditions? (i.e. diabetes, asthma, etc.) ---Yes    List chronic conditions. ---THYROID - MEDS FOR    Did the patient indicate they were pregnant? ---No           Guidelines          Guideline Title Affirmed Question Affirmed Notes Nurse Date/Time (Eastern Time)  Chest Pain Chest pain lasts > 5 minutes (Exceptions: chest pain occurring > 3 days ago and now asymptomatic; same as previously diagnosed heartburn and has accompanying sour taste in mouth)    [place], RN, Amy 03/16/2015 10:16:39 AM    Disp. Time Eilene Ghazi Time) Disposition Final User         03/16/2015 10:23:05 AM Go to ED Now (or PCP triage) Yes Mechele Dawley, RN, Amy            Caller Understands: Yes  Disagree/Comply: Comply       Care Advice Given Per Guideline        PRESSURE, WEAKNESS, FEELING OF GOING TO PASS OUT,  THEN NEED TO HAVE BM RIGHT AFTERWARDS.    After Care Instructions Given        Call Event Type User Date / Time Description        --------------------------------------------------------------------------------         Comments  User: [name], RN Date/Time Eilene Ghazi Time): 03/16/2015 10:24:32 AM  CALLER [facility] - Client TELEPHONE [place]    --------------------------------------------------------------------------------   Patient Name: Toni Boone  Gender: Female  DOB: Dec 22, 1960   Age: 54 Y 54 M 54 D  Return Phone Number: [phone] (Primary), [phone] (Secondary)  Address: [address]    City/State/Zip: [place]  [address]   Client [facility] -  Client  Client Site [facility]  Physician Penni Homans   Contact Type Call  Call Type Triage / Clinical       Relationship To Patient Self  Appointment Disposition EMR Appointment Not Necessary  Info pasted into Epic Yes  Return Phone Number [phone] (Primary)  Chief Complaint Chest Pain (non urgent symptoms)  Initial Comment Caller States she gets chest pains when she exercises and feels like she will pass out. she is not feeling those symptoms at the moment.        PreDisposition Did not know what to do            Nurse Assessment  Nurse: Mechele Dawley, RN, Amy Date/Time Eilene Ghazi Time): 03/16/2015 10:15:32 AM  Confirm and document reason for call. If symptomatic, describe symptoms. ---CALLER STATES THAT SHE IS GETTING SOME CHEST PAINS WHEN SHE IS EXERCISING WHEN SHE IS EXERCISING. SHE FEELS LIKE SHE IS GOING TO PASS OUT. SHE STATES THAT SHE HAS BEEN HAVING THESE ISSUES FOR A MONTH NOW. SHE HAS NOT SEEN MD ABOUT THIS CONDITION. SHE STATES THIS ONLY HAPPENS WHEN SHE IS EXERCISING. SHE STATES IT SEEMS TO BE MORE AFTERWARDS OF HER EXERCISE. SHE EXERCISES FOR AN HOUR - 1.5 HOURS. SHE DOES THIS AT LEAST 3 TIMES A WEEK. SHE HAD STARTED EXERCISING IN DECEMBER CONSISTENTLY. NO CHANGES IN EXERCISE ROUTINE. SHE FEELS LIKE SHE IS GOING TO PASS OUT. SHE STATES THAT SHE HAS TO SIT DOWN AND REST. IT USUALLY TAKES 20-30 MINUTES FOR IT TO PASS. IT DOES NOT COME BACK ON UNTIL THE NEXT TIME THAT SHE EXERCISES. SHE FEELS PRESSURE ACROSS THE CHEST AND ALL OF THIS LAST FOR ABOUT 20-30 MINUTES AND IT TAKES THIS LONG FOR IT TO SUBSIDE.    Has the patient traveled out of the country within the last 30 days? ---Not Applicable    Does the patient require triage? ---Yes    Related visit to physician within the last 2 weeks? ---No    Does the PT have any chronic conditions? (i.e. diabetes, asthma, etc.) ---Yes    List chronic conditions. ---THYROID - MEDS FOR    Did the patient indicate  they were pregnant? ---No           Guidelines          Guideline Title Affirmed Question Affirmed Notes Nurse Date/Time (Eastern Time)  Chest Pain Chest pain lasts > 5 minutes (Exceptions: chest pain occurring > 3 days ago and now asymptomatic; same as previously diagnosed heartburn and has accompanying sour taste in mouth)    [place], RN, Amy 03/16/2015 10:16:39 AM    Disp. Time Eilene Ghazi Time) Disposition Final User  03/16/2015 10:23:05 AM Go to ED Now (or PCP triage) Yes Mechele Dawley, RN, Amy            Caller Understands: Yes  Disagree/Comply: Comply       Care Advice Given Per Guideline        PRESSURE, WEAKNESS, FEELING OF GOING TO PASS OUT, THEN NEED TO HAVE BM RIGHT AFTERWARDS.    After Care Instructions Given        Call Event Type User Date / Time Description        --------------------------------------------------------------------------------         Comments  User: [name], RN Date/Time Eilene Ghazi Time): 03/16/2015 10:24:32 AM  CALLER WAS REFERRED TO THE ED NOW TO BE EVALUATED. 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 SHE STATES THAT SHE IS GOING TO TRY TO GO DIRECTLY TO A CARDIOLOGIST. SHE STATES THAT SHE THINKS SHE WILL JUST DO THAT. WILL CLOSE THIS CALL.    Referrals  [facility] - ED    AS REFERRED TO THE ED NOW TO BE EVALUATED. 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 SHE STATES THAT SHE IS GOING TO TRY TO GO DIRECTLY TO A CARDIOLOGIST. SHE STATES THAT SHE THINKS SHE WILL JUST DO THAT. WILL CLOSE THIS CALL.    Referrals  [facility] - ED

## 2015-03-16 NOTE — Telephone Encounter (Signed)
Patient scheduled appointment with cardiology.

## 2015-03-20 ENCOUNTER — Ambulatory Visit (INDEPENDENT_AMBULATORY_CARE_PROVIDER_SITE_OTHER): Payer: 59 | Admitting: Physician Assistant

## 2015-03-20 DIAGNOSIS — R002 Palpitations: Secondary | ICD-10-CM

## 2015-03-20 NOTE — Progress Notes (Signed)
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  She has a FHx of CAD in her father.  She recently started back to exercising and has noted chest pressure, nausea and dizziness at the end of exercise.  She is referred for ETT.  ECG with NSR, no ST changes.  Exam unremarkable.   Pre-Exercise Testing Evaluation Rhythm: normal sinus  Rate: 75 bpm     Test  Exercise Tolerance Test Ordering MD: Marijo File, MD  Interpreting MD: Richardson Dopp, PA-C  Unique Test No: 1  Treadmill:  1  Indication for ETT: chest pain - rule out ischemia  Contraindication to ETT: No   Stress Modality: exercise - treadmill  Cardiac Imaging Performed: non   Protocol: standard Bruce - maximal  Max BP:  179/57  Max MPHR (bpm):  167 85% MPR (bpm):  142  MPHR obtained (bpm):  166 % MPHR obtained:  99  Reached 85% MPHR (min:sec):  7:17 Total Exercise Time (min-sec):  10:00  Workload in METS:  11.7 Borg Scale: 18  Reason ETT Terminated:  desired heart rate attained    ST Segment Analysis At Rest: normal ST segments - no evidence of significant ST depression With Exercise: non-specific ST changes  Other Information Arrhythmia:  No Angina during ETT:  absent (0) Quality of ETT:  diagnostic  ETT Interpretation:  normal - no evidence of ischemia by ST analysis  Comments: Good exercise capacity. No chest pain. Normal BP response to exercise. There were non-specific ST changes at peak exercise that quickly resolved in recovery (likely artifact). There was up-sloping ST depression at peak exercise. No ST changes to suggest ischemia.  No arrhythmias.  Rare PVC. Good HR recovery in 1st minute post exercise at 2 mph on 2% grade (166 >> 144).  Recommendations: FU with Dr. Minus Breeding as directed. Signed,  Richardson Dopp, PA-C   03/20/2015 9:49 AM

## 2015-03-21 NOTE — Progress Notes (Signed)
Looks OK.  I left a message to call back if she has questions.

## 2015-04-10 ENCOUNTER — Encounter: Payer: Self-pay | Admitting: Family Medicine

## 2015-04-10 ENCOUNTER — Ambulatory Visit (INDEPENDENT_AMBULATORY_CARE_PROVIDER_SITE_OTHER): Payer: 59 | Admitting: Family Medicine

## 2015-04-10 VITALS — BP 122/56 | HR 74 | Temp 98.6°F | Ht 65.0 in | Wt 151.2 lb

## 2015-04-10 DIAGNOSIS — R55 Syncope and collapse: Secondary | ICD-10-CM | POA: Diagnosis not present

## 2015-04-10 DIAGNOSIS — N951 Menopausal and female climacteric states: Secondary | ICD-10-CM | POA: Diagnosis not present

## 2015-04-10 DIAGNOSIS — E039 Hypothyroidism, unspecified: Secondary | ICD-10-CM

## 2015-04-10 DIAGNOSIS — R109 Unspecified abdominal pain: Secondary | ICD-10-CM | POA: Diagnosis not present

## 2015-04-10 DIAGNOSIS — F419 Anxiety disorder, unspecified: Secondary | ICD-10-CM

## 2015-04-10 DIAGNOSIS — R1013 Epigastric pain: Secondary | ICD-10-CM

## 2015-04-10 DIAGNOSIS — R42 Dizziness and giddiness: Secondary | ICD-10-CM

## 2015-04-10 DIAGNOSIS — R232 Flushing: Secondary | ICD-10-CM

## 2015-04-10 MED ORDER — TRETINOIN 0.05 % EX CREA: TOPICAL_CREAM | Freq: Every day | CUTANEOUS | Status: DC

## 2015-04-10 MED ORDER — ESCITALOPRAM OXALATE 10 MG PO TABS: 10.0000 mg | ORAL_TABLET | Freq: Every day | ORAL | Status: DC

## 2015-04-10 MED ORDER — RANITIDINE HCL 300 MG PO TABS: 300.0000 mg | ORAL_TABLET | Freq: Every day | ORAL | Status: AC | PRN

## 2015-04-10 MED ORDER — LEVOTHYROXINE SODIUM 25 MCG PO TABS: 25.0000 ug | ORAL_TABLET | Freq: Every day | ORAL | Status: DC

## 2015-04-10 NOTE — Patient Instructions (Signed)
gatorade daily for 2 weeks Probiotic daily such as Digestive Advantage or PHill[employer]'s Colon Health, [email or web address].[email or web address] NOW company multistrain 10 probiotic Zinc, coldeeze, Mucinex twice daily x 10 days Increase fluids Call if fevers or worsening symptoms  Postural Orthostatic Tachycardia Syndrome Postural orthostatic tachycardia syndrome (POTS) is an increased heart rate when going from a lying (supine) position to a standing position. The heart rate may increase more than 30 beats per minute (BPM) above its resting rate when going from a lying to a standing position. POTS occurs more frequently in women than in men.  SYMPTOMS  POTS symptoms may be increased in the morning. Symptoms of POTS include:  Fainting or near fainting.  Inability to think clearly.  Extreme or chronic fatigue.  Exercise intolerance.  Chest pain.  Having the lower legs develop a reddish-blue color due to decreased blood flow (acrocyanosis). CAUSES POTS can be caused by different conditions. Sometimes, it has no known cause (idiopathic). Some causes of POTS include:  Viral illness.  Pregnancy.  Autoimmune diseases.  Medications.  Major surgery.  Trauma such as a car accident or major injury.  Medical conditions such as anemia, dehydration, and hyperthyroidism. DIAGNOSIS  POTS is diagnosed by:  Taking a complete history and physical exam.  Measuring the heart rate while lying and then upon standing.  Measuring blood pressure when going from a lying to a standing position. POTS is usually not associated with low blood pressure (orthostatic hypotension) when going from a lying to standing position. While standing, blood pressure should be taken 2, 5, and 10 minutes after getting up. TREATMENT  Treatment of POTS depends upon the severity of the symptoms. Treatment includes:  Drinking plenty of fluids to avoid getting dehydrated.  Avoiding very hot environments to not get overheated.  Increasing  your dietary salt intake as instructed by your caregiver.  Taking different types of medications as prescribed for POTS.  Avoiding some classes of medications such as vasodilators and diuretics. SEEK IMMEDIATE MEDICAL CARE IF  You have severe chest pain that does not go away. Call your local emergency service immediately.  You feel your heart racing or beating rapidly.  You feel like passing out.  You have very confused thinking. MAKE SURE YOU  Understand these instructions.  Will watch your condition.  Will get help right away if you are not doing well or get worse. Document Released: 11/21/2002 Document Revised: 04/17/2014 Document Reviewed: 01/29/2011 [facility] Patient Information 2015 [place], [place]. This information is not intended to replace advice given to you by your health care provider. Make sure you discuss any questions you have with your health care provider.

## 2015-04-11 ENCOUNTER — Telehealth: Payer: Self-pay | Admitting: *Deleted

## 2015-04-11 LAB — COMPREHENSIVE METABOLIC PANEL
ALBUMIN: 4.8 g/dL (ref 3.5–5.2)
ALT: 25 U/L (ref 0–35)
AST: 24 U/L (ref 0–37)
Alkaline Phosphatase: 63 U/L (ref 39–117)
BUN: 17 mg/dL (ref 6–23)
CO2: 30 mEq/L (ref 19–32)
Calcium: 10.1 mg/dL (ref 8.4–10.5)
Chloride: 101 mEq/L (ref 96–112)
Creatinine, Ser: 1.3 mg/dL — ABNORMAL HIGH (ref 0.40–1.20)
GFR: 45.4 mL/min — ABNORMAL LOW (ref >60.00)
Glucose, Bld: 83 mg/dL (ref 70–99)
POTASSIUM: 4.4 meq/L (ref 3.5–5.1)
SODIUM: 137 meq/L (ref 135–145)
Total Bilirubin: 0.3 mg/dL (ref 0.2–1.2)
Total Protein: 7.8 g/dL (ref 6.0–8.3)

## 2015-04-11 LAB — CBC
HEMATOCRIT: 40.8 % (ref 36.0–46.0)
Hemoglobin: 14.1 g/dL (ref 12.0–15.0)
MCHC: 34.5 g/dL (ref 30.0–36.0)
MCV: 91.2 fl (ref 78.0–100.0)
Platelets: 284 10*3/uL (ref 150.0–400.0)
RBC: 4.47 Mil/uL (ref 3.87–5.11)
RDW: 12.5 % (ref 11.5–15.5)
WBC: 10.2 10*3/uL (ref 4.0–10.5)

## 2015-04-11 LAB — H. PYLORI ANTIBODY, IGG: H Pylori IgG: NEGATIVE

## 2015-04-11 LAB — TSH: TSH: 5.17 u[IU]/mL — AB (ref 0.35–4.50)

## 2015-04-11 NOTE — Telephone Encounter (Signed)
Prior authorization for Tretinoin initiated. Awaiting determination. JG//CMA

## 2015-04-12 ENCOUNTER — Other Ambulatory Visit: Payer: Self-pay | Admitting: Family Medicine

## 2015-04-12 NOTE — Telephone Encounter (Signed)
Patient notified of lab results

## 2015-04-13 ENCOUNTER — Ambulatory Visit (HOSPITAL_BASED_OUTPATIENT_CLINIC_OR_DEPARTMENT_OTHER)
Admission: RE | Admit: 2015-04-13 | Discharge: 2015-04-13 | Disposition: A | Discharge status: Home / Self Care | Payer: 59 | Source: Ambulatory Visit | Attending: Family Medicine | Admitting: Family Medicine

## 2015-04-13 DIAGNOSIS — R55 Syncope and collapse: Secondary | ICD-10-CM | POA: Diagnosis present

## 2015-04-15 ENCOUNTER — Encounter: Payer: Self-pay | Admitting: Family Medicine

## 2015-04-15 DIAGNOSIS — R42 Dizziness and giddiness: Secondary | ICD-10-CM

## 2015-04-15 DIAGNOSIS — R1013 Epigastric pain: Secondary | ICD-10-CM

## 2015-04-15 HISTORY — DX: Epigastric pain: R10.13

## 2015-04-15 HISTORY — DX: Dizziness and giddiness: R42

## 2015-04-15 NOTE — Progress Notes (Signed)
Toni Boone  [id number] 06-Dec-1961 04/15/2015      Progress Note-Follow Up  Subjective  Chief Complaint  Chief Complaint  Patient presents with  . Fatigue    after working out     HPI  Patient is a 54 y.o. female in today for routine medical care. Patient is in today to discuss numerous concerns. Her mother has just passed away this past week and she has been under a great deal of stress going back and forth to [place]. 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 She is having worsening hot flashes and worsening anxiety as well as occasional palpitations. She had been trying to exercise more and has been experiencing some lightheadedness even presyncope. No falls or substernal chest pain but she has had some epigastric discomfort and dyspepsia at times. No change in bowel habits.   Past Medical History  Diagnosis Date  . Chicken pox as a child  . Anxiety   . 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 Actinic keratosis   . Vitamin D deficiency 2013  . Vitamin D deficiency   . Fibrocystic breast   . IBS (irritable bowel syndrome) 04/20/2013  . Cervical cancer screening 04/24/2013    Has a history of a cervical polyp found on evaluation of some cellular changes, D&C revealed only the polyp LMP 3/14  . Other and unspecified hyperlipidemia 06/02/2013  . Preventative health care 06/02/2013  . Unspecified hypothyroidism 08/26/2014  . Abdominal pain, epigastric 04/15/2015  . Light headedness 04/15/2015    Past Surgical History  Procedure Laterality Date  . Basal cell removal  2012    left shoulder  . Cholecystectomy  11-12  . Tonsillectomy  as a child  . Cesarean section  89 and 89  . Breast surgery  1996 and 2012    left breast- benign  . Axillary lymph node biopsy  80's    left  . Hemorrhoid surgery  1997    Family History  Problem Relation Age of Onset  . Hypertension Mother   . Cancer Mother 22    non hodgkin lymphoma, basal, scc  . Other  Mother     bilateral renal stenosis  . Cancer Father     pituitary adenoma, basal  . Stroke Father 40  . Aneurysm Father 61    brain  . Cancer Brother     basal   . Hypertension Maternal Grandmother   . Heart attack Maternal Grandmother   . Other Maternal Grandmother     ras  . Heart attack Maternal Grandfather   . Stroke Paternal Grandmother   . Emphysema Paternal Grandfather     smoker  . COPD Paternal Grandfather     smoker  . Other Maternal Aunt     RAS/RAS  . Hypertension Maternal Aunt   . Colon cancer Neg Hx     History   Social History  . Marital Status: Married    Spouse Name: N/A  . Number of Children: 2  . Years of Education: master's   Occupational History  . [employer]    Social History Main Topics  . Smoking status: Never Smoker   . Smokeless tobacco: Never Used  . Alcohol Use: 2.4 oz/week    4 Glasses of wine per week  . Drug Use: No  . Sexual Activity:    Partners: Male     Comment: lives with husband, no dietary restrictions, works  with [employer]   Other Topics Concern  . Not on file   Social History Narrative    Current Outpatient Prescriptions on File Prior to Visit  Medication Sig Dispense Refill  . ALPRAZolam (XANAX) 0.5 MG tablet Take 0.5-1 tablets (0.25-0.5 mg total) by mouth daily as needed for anxiety. 30 tablet 1  . Multiple Vitamin (MULTIVITAMIN WITH MINERALS) TABS tablet Take 1 tablet by mouth daily.    Marland Kitchen OVER THE COUNTER MEDICATION Apply 1 application topically 3 (three) times a week. OTC progesterone cream.     No current facility-administered medications on file prior to visit.    Allergies  Allergen Reactions  . Avelox [Moxifloxacin Hcl In Nacl] Anaphylaxis  . Penicillins Anaphylaxis  . Codeine     GI upset  . Macrodantin [Nitrofurantoin Macrocrystal]     Burning throat  . Aspirin Diarrhea, Nausea Only and Anxiety    Review of Systems  Review of Systems  Constitutional: Negative for fever and malaise/fatigue.    HENT: Negative for congestion.   Eyes: Negative for discharge.  Respiratory: Negative for shortness of breath.   Cardiovascular: Negative for chest pain, palpitations and leg swelling.  Gastrointestinal: Negative for nausea, abdominal pain and diarrhea.  Genitourinary: Negative for dysuria.  Musculoskeletal: Negative for falls.  Skin: Negative for rash.  Neurological: Negative for loss of consciousness and headaches.  Endo/Heme/Allergies: Negative for polydipsia.  Psychiatric/Behavioral: Negative for depression and suicidal ideas. The patient is not nervous/anxious and does not have insomnia.     Objective  BP 122/56 mmHg  Pulse 74  Temp(Src) 98.6 F (37 C) (Oral)  Ht 5\' 5"  (1.651 m)  Wt 151 lb 4 oz (68.607 kg)  BMI 25.17 kg/m2  SpO2 100%  Physical Exam  Physical Exam  Constitutional: She is oriented to person, place, and time and well-developed, well-nourished, and in no distress. No distress.  HENT:  Head: Normocephalic and atraumatic.  Eyes: Conjunctivae are normal.  Neck: Neck supple. No thyromegaly present.  Cardiovascular: Normal rate, regular rhythm and normal heart sounds.   No murmur heard. Pulmonary/Chest: Effort normal and breath sounds normal. She has no wheezes.  Abdominal: She exhibits no distension and no mass.  Musculoskeletal: She exhibits no edema.  Lymphadenopathy:    She has no cervical adenopathy.  Neurological: She is alert and oriented to person, place, and time.  Skin: Skin is warm and dry. No rash noted. She is not diaphoretic.  Psychiatric: Memory, affect and judgment normal.    Lab Results  Component Value Date   TSH 5.17* 04/10/2015   Lab Results  Component Value Date   WBC 10.2 04/10/2015   HGB 14.1 04/10/2015   HCT 40.8 04/10/2015   MCV 91.2 04/10/2015   PLT 284.0 04/10/2015   Lab Results  Component Value Date   CREATININE 1.30* 04/10/2015   BUN 17 04/10/2015   NA 137 04/10/2015   K 4.4 04/10/2015   CL 101 04/10/2015   CO2  30 04/10/2015   Lab Results  Component Value Date   ALT 25 04/10/2015   AST 24 04/10/2015   ALKPHOS 63 04/10/2015   BILITOT 0.3 04/10/2015   Lab Results  Component Value Date   CHOL 230* 09/29/2014   Lab Results  Component Value Date   HDL 46.70 09/29/2014   Lab Results  Component Value Date   LDLCALC 164* 09/29/2014   Lab Results  Component Value Date   TRIG 99.0 09/29/2014   Lab Results  Component Value Date   CHOLHDL  5 09/29/2014     Assessment & Plan  Hypothyroidism On Levothyroxine, continue to monitor   Anxiety Her mother died suddenly last week. Is struggling with stressors, will start Escitalopram and may continue prn Alprazolam   Abdominal pain, epigastric H Pylori test negative. Encouraged daily probiotics. Avoid offending foods, start probiotics. Do not eat large meals in late evening and consider raising head of bed.    Light headedness Presyncope noted recently with increased exercise. Will proceed with carotid dopplers. encouraged adequate hydration and eat small, frequent meals with lean proteins each meal

## 2015-04-15 NOTE — Assessment & Plan Note (Signed)
On Levothyroxine, continue to monitor 

## 2015-04-15 NOTE — Assessment & Plan Note (Signed)
H Pylori test negative. Encouraged daily probiotics. Avoid offending foods, start probiotics. Do not eat large meals in late evening and consider raising head of bed.

## 2015-04-15 NOTE — Assessment & Plan Note (Addendum)
Presyncope noted recently with increased exercise. Will proceed with carotid dopplers. encouraged adequate hydration and eat small, frequent meals with lean proteins each meal

## 2015-04-15 NOTE — Assessment & Plan Note (Addendum)
Her mother died suddenly last week. Is struggling with stressors, will start Escitalopram and may continue prn Alprazolam

## 2015-04-18 NOTE — Telephone Encounter (Signed)
PA approved effective 04/11/2015 through 04/10/2016 by [employer]. Case # [id number]. Approval letter sent for scanning. JG//CMA

## 2015-06-04 ENCOUNTER — Ambulatory Visit: Payer: Self-pay | Admitting: Family Medicine

## 2015-07-13 ENCOUNTER — Ambulatory Visit: Payer: Self-pay | Admitting: Family Medicine

## 2015-07-24 ENCOUNTER — Encounter: Payer: Self-pay | Admitting: Family Medicine

## 2015-07-24 ENCOUNTER — Ambulatory Visit (INDEPENDENT_AMBULATORY_CARE_PROVIDER_SITE_OTHER): Payer: 59 | Admitting: Family Medicine

## 2015-07-24 VITALS — BP 125/70 | HR 63 | Temp 98.4°F | Ht 65.0 in | Wt 150.0 lb

## 2015-07-24 DIAGNOSIS — F411 Generalized anxiety disorder: Secondary | ICD-10-CM | POA: Diagnosis not present

## 2015-07-24 DIAGNOSIS — F419 Anxiety disorder, unspecified: Secondary | ICD-10-CM | POA: Diagnosis not present

## 2015-07-24 DIAGNOSIS — E039 Hypothyroidism, unspecified: Secondary | ICD-10-CM

## 2015-07-24 DIAGNOSIS — N289 Disorder of kidney and ureter, unspecified: Secondary | ICD-10-CM | POA: Diagnosis not present

## 2015-07-24 MED ORDER — ALPRAZOLAM 0.5 MG PO TABS: 0.2500 mg | ORAL_TABLET | Freq: Every day | ORAL | Status: DC | PRN

## 2015-07-24 NOTE — Patient Instructions (Signed)
ICool daily  If ICool ineffective can consider a trial of BlackCohosh or Evening Primrose oil for hot flashes NOW company Krill oil EC Vitamin D 2000 Probiotic 10 strain 1 cap daily Curcumen caps daily   Perimenopause Perimenopause is the time when your body begins to move into the menopause (no menstrual period for 12 straight months). It is a natural process. Perimenopause can begin 2-8 years before the menopause and usually lasts for 1 year after the menopause. During this time, your ovaries may or may not produce an egg. The ovaries vary in their production of estrogen and progesterone hormones each month. This can cause irregular menstrual periods, difficulty getting pregnant, vaginal bleeding between periods, and uncomfortable symptoms. CAUSES  Irregular production of the ovarian hormones, estrogen and progesterone, and not ovulating every month.  Other causes include:  Tumor of the pituitary gland in the brain.  Medical disease that affects the ovaries.  Radiation treatment.  Chemotherapy.  Unknown causes.  Heavy smoking and excessive alcohol intake can bring on perimenopause sooner. SIGNS AND SYMPTOMS   Hot flashes.  Night sweats.  Irregular menstrual periods.  Decreased sex drive.  Vaginal dryness.  Headaches.  Mood swings.  Depression.  Memory problems.  Irritability.  Tiredness.  Weight gain.  Trouble getting pregnant.  The beginning of losing bone cells (osteoporosis).  The beginning of hardening of the arteries (atherosclerosis). DIAGNOSIS  Your health care provider will make a diagnosis by analyzing your age, menstrual history, and symptoms. He or she will do a physical exam and note any changes in your body, especially your female organs. Female hormone tests may or may not be helpful depending on the amount of female hormones you produce and when you produce them. However, other hormone tests may be helpful to rule out other  problems. TREATMENT  In some cases, no treatment is needed. The decision on whether treatment is necessary during the perimenopause should be made by you and your health care provider based on how the symptoms are affecting you and your lifestyle. Various treatments are available, such as:  Treating individual symptoms with a specific medicine for that symptom.  Herbal medicines that can help specific symptoms.  Counseling.  Group therapy. HOME CARE INSTRUCTIONS   Keep track of your menstrual periods (when they occur, how heavy they are, how long between periods, and how long they last) as well as your symptoms and when they started.  Only take over-the-counter or prescription medicines as directed by your health care provider.  Sleep and rest.  Exercise.  Eat a diet that contains calcium (good for your bones) and soy (acts like the estrogen hormone).  Do not smoke.  Avoid alcoholic beverages.  Take vitamin supplements as recommended by your health care provider. Taking vitamin E may help in certain cases.  Take calcium and vitamin D supplements to help prevent bone loss.  Group therapy is sometimes helpful.  Acupuncture may help in some cases. SEEK MEDICAL CARE IF:   You have questions about any symptoms you are having.  You need a referral to a specialist (gynecologist, psychiatrist, or psychologist). SEEK IMMEDIATE MEDICAL CARE IF:   You have vaginal bleeding.  Your period lasts longer than 8 days.  Your periods are recurring sooner than 21 days.  You have bleeding after intercourse.  You have severe depression.  You have pain when you urinate.  You have severe headaches.  You have vision problems. Document Released: 01/08/2005 Document Revised: 09/21/2013 Document Reviewed: 06/30/2013 [employer] Patient  Information 2015 [place], [place]. This information is not intended to replace advice given to you by your health care provider. Make sure you discuss any  questions you have with your health care provider.

## 2015-07-24 NOTE — Progress Notes (Signed)
Pre visit review using our clinic review tool, if applicable. No additional management support is needed unless otherwise documented below in the visit note. 

## 2015-07-25 LAB — COMPREHENSIVE METABOLIC PANEL
ALBUMIN: 4.6 g/dL (ref 3.5–5.2)
ALK PHOS: 58 U/L (ref 39–117)
ALT: 24 U/L (ref 0–35)
AST: 21 U/L (ref 0–37)
BUN: 22 mg/dL (ref 6–23)
CHLORIDE: 101 meq/L (ref 96–112)
CO2: 29 mEq/L (ref 19–32)
Calcium: 9.9 mg/dL (ref 8.4–10.5)
Creatinine, Ser: 0.99 mg/dL (ref 0.40–1.20)
GFR: 62.11 mL/min (ref >60.00)
Glucose, Bld: 92 mg/dL (ref 70–99)
POTASSIUM: 4.2 meq/L (ref 3.5–5.1)
SODIUM: 139 meq/L (ref 135–145)
Total Bilirubin: 0.3 mg/dL (ref 0.2–1.2)
Total Protein: 8 g/dL (ref 6.0–8.3)

## 2015-07-25 LAB — TSH: TSH: 4.63 u[IU]/mL — AB (ref 0.35–4.50)

## 2015-07-26 ENCOUNTER — Other Ambulatory Visit: Payer: Self-pay | Admitting: Family Medicine

## 2015-07-26 MED ORDER — LEVOTHYROXINE SODIUM 50 MCG PO TABS: 50.0000 ug | ORAL_TABLET | Freq: Every day | ORAL | Status: DC

## 2015-08-11 ENCOUNTER — Encounter: Payer: Self-pay | Admitting: Family Medicine

## 2015-08-11 NOTE — Assessment & Plan Note (Addendum)
Managing the loss of her mother and handling her estate. Does not feel overwhelmed by depressive symptoms or anxiety daily. Does use Alprazolam to good effect infrequently, may continue

## 2015-08-11 NOTE — Assessment & Plan Note (Addendum)
tsh improving, will increase Levothyroxine 50 mcg daily, recheck tsh in 3 months

## 2015-08-11 NOTE — Progress Notes (Signed)
Subjective:    Patient ID: Toni Boone, female    DOB: 12/14/1961, 54 y.o.   MRN: [id number]  Chief Complaint  Patient presents with  . Follow-up    HPI Patient is in today for follow up. She continues to struggle with stress and perimenopausal symptoms. Having some episodes of hot flashes. 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 No anhedonia or suicidal ideation. Denies CP/palp/SOB/HA/congestion/fevers/GI or GU c/o. Taking meds as prescribed  Past Medical History  Diagnosis Date  . Chicken pox as a child  . Anxiety   . 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 Actinic keratosis   . Vitamin D deficiency 2013  . Vitamin D deficiency   . Fibrocystic breast   . IBS (irritable bowel syndrome) 04/20/2013  . Cervical cancer screening 04/24/2013    Has a history of a cervical polyp found on evaluation of some cellular changes, D&C revealed only the polyp LMP 3/14  . Other and unspecified hyperlipidemia 06/02/2013  . Preventative health care 06/02/2013  . Unspecified hypothyroidism 08/26/2014  . Abdominal pain, epigastric 04/15/2015  . Light headedness 04/15/2015    Past Surgical History  Procedure Laterality Date  . Basal cell removal  2012    left shoulder  . Cholecystectomy  11-12  . Tonsillectomy  as a child  . Cesarean section  94 and 95  . Breast surgery  1996 and 2012    left breast- benign  . Axillary lymph node biopsy  80's    left  . Hemorrhoid surgery  1997    Family History  Problem Relation Age of Onset  . Hypertension Mother   . Cancer Mother 101    non hodgkin lymphoma, basal, scc  . Other Mother     bilateral renal stenosis  . Cancer Father     pituitary adenoma, basal  . Stroke Father 79  . Aneurysm Father 46    brain  . Cancer Brother     basal   . Hypertension Maternal Grandmother   . Heart attack Maternal Grandmother   . Other Maternal Grandmother     ras  . Heart attack Maternal Grandfather    . Stroke Paternal Grandmother   . Emphysema Paternal Grandfather     smoker  . COPD Paternal Grandfather     smoker  . Other Maternal Aunt     RAS/RAS  . Hypertension Maternal Aunt   . Colon cancer Neg Hx     Social History   Social History  . Marital Status: Married    Spouse Name: N/A  . Number of Children: 2  . Years of Education: master's   Occupational History  . [employer]    Social History Main Topics  . Smoking status: Never Smoker   . Smokeless tobacco: Never Used  . Alcohol Use: 2.4 oz/week    4 Glasses of wine per week  . Drug Use: No  . Sexual Activity:    Partners: Male     Comment: lives with husband, no dietary restrictions, works with [employer]   Other Topics Concern  . Not on file   Social History Narrative    Outpatient Prescriptions Prior to Visit  Medication Sig Dispense Refill  . Multiple Vitamin (MULTIVITAMIN WITH MINERALS) TABS tablet Take 1 tablet by mouth daily.    . ranitidine (ZANTAC) 300 MG tablet Take 1 tablet (300 mg total) by mouth daily as needed  for heartburn. 30 tablet 3  . tretinoin (RETIN-A) 0.05 % cream Apply topically at bedtime. 45 g 8  . ALPRAZolam (XANAX) 0.5 MG tablet Take 0.5-1 tablets (0.25-0.5 mg total) by mouth daily as needed for anxiety. 30 tablet 1  . levothyroxine (SYNTHROID, LEVOTHROID) 25 MCG tablet Take 1 tablet (25 mcg total) by mouth daily before breakfast. 90 tablet 3  . OVER THE COUNTER MEDICATION Apply 1 application topically 3 (three) times a week. OTC progesterone cream.    . escitalopram (LEXAPRO) 10 MG tablet Take 1 tablet (10 mg total) by mouth daily. (Patient not taking: Reported on 07/24/2015) 30 tablet 3   No facility-administered medications prior to visit.    Allergies  Allergen Reactions  . Avelox [Moxifloxacin Hcl In Nacl] Anaphylaxis  . Penicillins Anaphylaxis  . Codeine     GI upset  . Macrodantin [Nitrofurantoin Macrocrystal]     Burning throat  . Aspirin Diarrhea, Nausea Only and Anxiety      Review of Systems  Constitutional: Positive for malaise/fatigue and diaphoresis. Negative for fever.  HENT: Negative for congestion.   Eyes: Negative for discharge.  Respiratory: Negative for shortness of breath.   Cardiovascular: Negative for chest pain, palpitations and leg swelling.  Gastrointestinal: Negative for nausea and abdominal pain.  Genitourinary: Negative for dysuria.  Musculoskeletal: Negative for falls.  Skin: Negative for rash.  Neurological: Negative for loss of consciousness and headaches.  Endo/Heme/Allergies: Negative for environmental allergies.  Psychiatric/Behavioral: Negative for depression. The patient is nervous/anxious.        Objective:    Physical Exam  Constitutional: She is oriented to person, place, and time. She appears well-developed and well-nourished. No distress.  HENT:  Head: Normocephalic and atraumatic.  Nose: Nose normal.  Eyes: Right eye exhibits no discharge. Left eye exhibits no discharge.  Neck: Normal range of motion. Neck supple.  Cardiovascular: Normal rate and regular rhythm.   No murmur heard. Pulmonary/Chest: Effort normal and breath sounds normal.  Abdominal: Soft. Bowel sounds are normal. There is no tenderness.  Musculoskeletal: She exhibits no edema.  Neurological: She is alert and oriented to person, place, and time.  Skin: Skin is warm and dry.  Psychiatric: She has a normal mood and affect.  Nursing note and vitals reviewed.   BP 125/70 mmHg  Pulse 63  Temp(Src) 98.4 F (36.9 C) (Oral)  Ht 5\' 5"  (1.651 m)  Wt 150 lb (68.04 kg)  BMI 24.96 kg/m2  SpO2 98% Wt Readings from Last 3 Encounters:  07/24/15 150 lb (68.04 kg)  04/10/15 151 lb 4 oz (68.607 kg)  08/23/14 145 lb 6.4 oz (65.953 kg)     Lab Results  Component Value Date   WBC 10.2 04/10/2015   HGB 14.1 04/10/2015   HCT 40.8 04/10/2015   PLT 284.0 04/10/2015   GLUCOSE 92 07/24/2015   CHOL 230* 09/29/2014   TRIG 99.0 09/29/2014   HDL 46.70  09/29/2014   LDLCALC 164* 09/29/2014   ALT 24 07/24/2015   AST 21 07/24/2015   NA 139 07/24/2015   K 4.2 07/24/2015   CL 101 07/24/2015   CREATININE 0.99 07/24/2015   BUN 22 07/24/2015   CO2 29 07/24/2015   TSH 4.63* 07/24/2015    Lab Results  Component Value Date   TSH 4.63* 07/24/2015   Lab Results  Component Value Date   WBC 10.2 04/10/2015   HGB 14.1 04/10/2015   HCT 40.8 04/10/2015   MCV 91.2 04/10/2015   PLT 284.0 04/10/2015  Lab Results  Component Value Date   NA 139 07/24/2015   K 4.2 07/24/2015   CO2 29 07/24/2015   GLUCOSE 92 07/24/2015   BUN 22 07/24/2015   CREATININE 0.99 07/24/2015   BILITOT 0.3 07/24/2015   ALKPHOS 58 07/24/2015   AST 21 07/24/2015   ALT 24 07/24/2015   PROT 8.0 07/24/2015   ALBUMIN 4.6 07/24/2015   CALCIUM 9.9 07/24/2015   ANIONGAP 14 07/28/2014   GFR 62.11 07/24/2015   Lab Results  Component Value Date   CHOL 230* 09/29/2014   Lab Results  Component Value Date   HDL 46.70 09/29/2014   Lab Results  Component Value Date   LDLCALC 164* 09/29/2014   Lab Results  Component Value Date   TRIG 99.0 09/29/2014   Lab Results  Component Value Date   CHOLHDL 5 09/29/2014   No results found for: HGBA1C     Assessment & Plan:   Problem List Items Addressed This Visit    Hypothyroidism    tsh improving, will increase Levothyroxine 50 mcg daily, recheck tsh in 3 months      Relevant Orders   TSH (Completed)   Anxiety    Managing the loss of her mother and handling her estate. Does not feel overwhelmed by depressive symptoms or anxiety daily. Does use Alprazolam to good effect infrequently, may continue      Relevant Medications   ALPRAZolam (XANAX) 0.5 MG tablet    Other Visit Diagnoses    Generalized anxiety disorder    -  Primary    Relevant Medications    ALPRAZolam (XANAX) 0.5 MG tablet    Renal insufficiency        Relevant Orders    Comprehensive metabolic panel (Completed)       I have discontinued  Ms. Mullins's escitalopram. I am also having her maintain her multivitamin with minerals, OVER THE COUNTER MEDICATION, tretinoin, ranitidine, and ALPRAZolam.  Meds ordered this encounter  Medications  . ALPRAZolam (XANAX) 0.5 MG tablet    Sig: Take 0.5-1 tablets (0.25-0.5 mg total) by mouth daily as needed for anxiety.    Dispense:  30 tablet    Refill:  0     Penni Homans, MD

## 2015-10-19 ENCOUNTER — Encounter: Payer: Self-pay | Admitting: Family Medicine

## 2015-10-19 ENCOUNTER — Ambulatory Visit (HOSPITAL_BASED_OUTPATIENT_CLINIC_OR_DEPARTMENT_OTHER)
Admission: RE | Admit: 2015-10-19 | Discharge: 2015-10-19 | Disposition: A | Discharge status: Home / Self Care | Payer: 59 | Source: Ambulatory Visit | Attending: Family Medicine | Admitting: Family Medicine

## 2015-10-19 ENCOUNTER — Other Ambulatory Visit (HOSPITAL_COMMUNITY)
Admission: RE | Admit: 2015-10-19 | Discharge: 2015-10-19 | Disposition: A | Discharge status: Home / Self Care | Payer: 59 | Source: Ambulatory Visit | Attending: Family Medicine | Admitting: Family Medicine

## 2015-10-19 ENCOUNTER — Ambulatory Visit (INDEPENDENT_AMBULATORY_CARE_PROVIDER_SITE_OTHER): Payer: 59 | Admitting: Family Medicine

## 2015-10-19 VITALS — BP 120/82 | HR 65 | Temp 97.9°F | Ht 65.0 in | Wt 149.0 lb

## 2015-10-19 DIAGNOSIS — E039 Hypothyroidism, unspecified: Secondary | ICD-10-CM | POA: Diagnosis not present

## 2015-10-19 DIAGNOSIS — Z0000 Encounter for general adult medical examination without abnormal findings: Secondary | ICD-10-CM

## 2015-10-19 DIAGNOSIS — M799 Soft tissue disorder, unspecified: Secondary | ICD-10-CM

## 2015-10-19 DIAGNOSIS — E559 Vitamin D deficiency, unspecified: Secondary | ICD-10-CM

## 2015-10-19 DIAGNOSIS — E782 Mixed hyperlipidemia: Secondary | ICD-10-CM

## 2015-10-19 DIAGNOSIS — M1288 Other specific arthropathies, not elsewhere classified, other specified site: Secondary | ICD-10-CM | POA: Insufficient documentation

## 2015-10-19 DIAGNOSIS — R1031 Right lower quadrant pain: Secondary | ICD-10-CM

## 2015-10-19 DIAGNOSIS — Z01419 Encounter for gynecological examination (general) (routine) without abnormal findings: Secondary | ICD-10-CM | POA: Insufficient documentation

## 2015-10-19 DIAGNOSIS — M5136 Other intervertebral disc degeneration, lumbar region: Secondary | ICD-10-CM | POA: Diagnosis not present

## 2015-10-19 DIAGNOSIS — Z124 Encounter for screening for malignant neoplasm of cervix: Secondary | ICD-10-CM | POA: Diagnosis not present

## 2015-10-19 DIAGNOSIS — M545 Low back pain: Secondary | ICD-10-CM | POA: Diagnosis not present

## 2015-10-19 DIAGNOSIS — M25551 Pain in right hip: Secondary | ICD-10-CM | POA: Diagnosis not present

## 2015-10-19 DIAGNOSIS — M7989 Other specified soft tissue disorders: Secondary | ICD-10-CM

## 2015-10-19 LAB — URINALYSIS
Bilirubin Urine: NEGATIVE
Hgb urine dipstick: NEGATIVE
Ketones, ur: NEGATIVE
Leukocytes, UA: NEGATIVE
NITRITE: NEGATIVE
TOTAL PROTEIN, URINE-UPE24: NEGATIVE
URINE GLUCOSE: NEGATIVE
Urobilinogen, UA: 0.2 (ref 0.0–1.0)
pH: 6.5 (ref 5.0–8.0)

## 2015-10-19 LAB — TSH: TSH: 3.39 u[IU]/mL (ref 0.35–4.50)

## 2015-10-19 LAB — COMPREHENSIVE METABOLIC PANEL
ALT: 27 U/L (ref 0–35)
AST: 22 U/L (ref 0–37)
Albumin: 4.3 g/dL (ref 3.5–5.2)
Alkaline Phosphatase: 59 U/L (ref 39–117)
BUN: 11 mg/dL (ref 6–23)
CHLORIDE: 104 meq/L (ref 96–112)
CO2: 30 meq/L (ref 19–32)
CREATININE: 0.85 mg/dL (ref 0.40–1.20)
Calcium: 10 mg/dL (ref 8.4–10.5)
GFR: 73.99 mL/min (ref >60.00)
Glucose, Bld: 91 mg/dL (ref 70–99)
POTASSIUM: 4.2 meq/L (ref 3.5–5.1)
SODIUM: 139 meq/L (ref 135–145)
Total Bilirubin: 0.5 mg/dL (ref 0.2–1.2)
Total Protein: 7.4 g/dL (ref 6.0–8.3)

## 2015-10-19 LAB — CBC
HEMATOCRIT: 38.8 % (ref 36.0–46.0)
HEMOGLOBIN: 13 g/dL (ref 12.0–15.0)
MCHC: 33.5 g/dL (ref 30.0–36.0)
MCV: 92.4 fl (ref 78.0–100.0)
Platelets: 249 10*3/uL (ref 150.0–400.0)
RBC: 4.2 Mil/uL (ref 3.87–5.11)
RDW: 13 % (ref 11.5–15.5)
WBC: 6.1 10*3/uL (ref 4.0–10.5)

## 2015-10-19 LAB — LIPID PANEL
CHOL/HDL RATIO: 4
Cholesterol: 186 mg/dL (ref 0–200)
HDL: 48.8 mg/dL (ref >39.00)
LDL CALC: 121 mg/dL — AB (ref 0–99)
NONHDL: 137.31
Triglycerides: 83 mg/dL (ref 0.0–149.0)
VLDL: 16.6 mg/dL (ref 0.0–40.0)

## 2015-10-19 MED ORDER — CYCLOBENZAPRINE HCL 10 MG PO TABS
10.0000 mg | ORAL_TABLET | Freq: Every evening | ORAL | Status: DC | PRN
Start: 2015-10-19 — End: 2016-01-22

## 2015-10-19 NOTE — Progress Notes (Signed)
Pre visit review using our clinic review tool, if applicable. No additional management support is needed unless otherwise documented below in the visit note. 

## 2015-10-19 NOTE — Patient Instructions (Addendum)
Ibuprofen 200 mg, 1-2 tabs 2-3 times daily with food  Vitamin D 2000 and 5000 IU daily  Preventive Care for Adults, Female A healthy lifestyle and preventive care can promote health and wellness. Preventive health guidelines for women include the following key practices.  A routine yearly physical is a good way to check with your health care provider about your health and preventive screening. It is a chance to share any concerns and updates on your health and to receive a thorough exam.  Visit your dentist for a routine exam and preventive care every 6 months. Brush your teeth twice a day and floss once a day. Good oral hygiene prevents tooth decay and gum disease.  The frequency of eye exams is based on your age, health, family medical history, use of contact lenses, and other factors. Follow your health care provider's recommendations for frequency of eye exams.  Eat a healthy diet. Foods like vegetables, fruits, whole grains, low-fat dairy products, and lean protein foods contain the nutrients you need without too many calories. Decrease your intake of foods high in solid fats, added sugars, and salt. Eat the right amount of calories for you.Get information about a proper diet from your health care provider, if necessary.  Regular physical exercise is one of the most important things you can do for your health. Most adults should get at least 150 minutes of moderate-intensity exercise (any activity that increases your heart rate and causes you to sweat) each week. In addition, most adults need muscle-strengthening exercises on 2 or more days a week.  Maintain a healthy weight. The body mass index (BMI) is a screening tool to identify possible weight problems. It provides an estimate of body fat based on height and weight. Your health care provider can find your BMI and can help you achieve or maintain a healthy weight.For adults 20 years and older:  A BMI below 18.5 is considered  underweight.  A BMI of 18.5 to 24.9 is normal.  A BMI of 25 to 29.9 is considered overweight.  A BMI of 30 and above is considered obese.  Maintain normal blood lipids and cholesterol levels by exercising and minimizing your intake of saturated fat. Eat a balanced diet with plenty of fruit and vegetables. Blood tests for lipids and cholesterol should begin at age 27 and be repeated every 5 years. If your lipid or cholesterol levels are high, you are over 50, or you are at high risk for heart disease, you may need your cholesterol levels checked more frequently.Ongoing high lipid and cholesterol levels should be treated with medicines if diet and exercise are not working.  If you smoke, find out from your health care provider how to quit. If you do not use tobacco, do not start.  Lung cancer screening is recommended for adults aged 48-80 years who are at high risk for developing lung cancer because of a history of smoking. A yearly low-dose CT scan of the lungs is recommended for people who have at least a 30-pack-year history of smoking and are a current smoker or have quit within the past 15 years. A pack year of smoking is smoking an average of 1 pack of cigarettes a day for 1 year (for example: 1 pack a day for 30 years or 2 packs a day for 15 years). Yearly screening should continue until the smoker has stopped smoking for at least 15 years. Yearly screening should be stopped for people who develop a health problem that would  prevent them from having lung cancer treatment.  If you are pregnant, do not drink alcohol. If you are breastfeeding, be very cautious about drinking alcohol. If you are not pregnant and choose to drink alcohol, do not have more than 1 drink per day. One drink is considered to be 12 ounces (355 mL) of beer, 5 ounces (148 mL) of wine, or 1.5 ounces (44 mL) of liquor.  Avoid use of street drugs. Do not share needles with anyone. Ask for help if you need support or  instructions about stopping the use of drugs.  High blood pressure causes heart disease and increases the risk of stroke. Your blood pressure should be checked at least every 1 to 2 years. Ongoing high blood pressure should be treated with medicines if weight loss and exercise do not work.  If you are 17-30 years old, ask your health care provider if you should take aspirin to prevent strokes.  Diabetes screening is done by taking a blood sample to check your blood glucose level after you have not eaten for a certain period of time (fasting). If you are not overweight and you do not have risk factors for diabetes, you should be screened once every 3 years starting at age 83. If you are overweight or obese and you are 28-71 years of age, you should be screened for diabetes every year as part of your cardiovascular risk assessment.  Breast cancer screening is essential preventive care for women. You should practice "breast self-awareness." This means understanding the normal appearance and feel of your breasts and may include breast self-examination. Any changes detected, no matter how small, should be reported to a health care provider. Women in their 10s and 30s should have a clinical breast exam (CBE) by a health care provider as part of a regular health exam every 1 to 3 years. After age 23, women should have a CBE every year. Starting at age 103, women should consider having a mammogram (breast X-ray test) every year. Women who have a family history of breast cancer should talk to their health care provider about genetic screening. Women at a high risk of breast cancer should talk to their health care providers about having an MRI and a mammogram every year.  Breast cancer gene (BRCA)-related cancer risk assessment is recommended for women who have family members with BRCA-related cancers. BRCA-related cancers include breast, ovarian, tubal, and peritoneal cancers. Having family members with these  cancers may be associated with an increased risk for harmful changes (mutations) in the breast cancer genes BRCA1 and BRCA2. Results of the assessment will determine the need for genetic counseling and BRCA1 and BRCA2 testing.  Your health care provider may recommend that you be screened regularly for cancer of the pelvic organs (ovaries, uterus, and vagina). This screening involves a pelvic examination, including checking for microscopic changes to the surface of your cervix (Pap test). You may be encouraged to have this screening done every 3 years, beginning at age 28.  For women ages 41-65, health care providers may recommend pelvic exams and Pap testing every 3 years, or they may recommend the Pap and pelvic exam, combined with testing for human papilloma virus (HPV), every 5 years. Some types of HPV increase your risk of cervical cancer. Testing for HPV may also be done on women of any age with unclear Pap test results.  Other health care providers may not recommend any screening for nonpregnant women who are considered low risk for pelvic cancer  and who do not have symptoms. Ask your health care provider if a screening pelvic exam is right for you.  If you have had past treatment for cervical cancer or a condition that could lead to cancer, you need Pap tests and screening for cancer for at least 20 years after your treatment. If Pap tests have been discontinued, your risk factors (such as having a new sexual partner) need to be reassessed to determine if screening should resume. Some women have medical problems that increase the chance of getting cervical cancer. In these cases, your health care provider may recommend more frequent screening and Pap tests.  Colorectal cancer can be detected and often prevented. Most routine colorectal cancer screening begins at the age of 38 years and continues through age 79 years. However, your health care provider may recommend screening at an earlier age if you  have risk factors for colon cancer. On a yearly basis, your health care provider may provide home test kits to check for hidden blood in the stool. Use of a small camera at the end of a tube, to directly examine the colon (sigmoidoscopy or colonoscopy), can detect the earliest forms of colorectal cancer. Talk to your health care provider about this at age 41, when routine screening begins. Direct exam of the colon should be repeated every 5-10 years through age 45 years, unless early forms of precancerous polyps or small growths are found.  People who are at an increased risk for hepatitis B should be screened for this virus. You are considered at high risk for hepatitis B if:  You were born in a country where hepatitis B occurs often. Talk with your health care provider about which countries are considered high risk.  Your parents were born in a high-risk country and you have not received a shot to protect against hepatitis B (hepatitis B vaccine).  You have HIV or AIDS.  You use needles to inject street drugs.  You live with, or have sex with, someone who has hepatitis B.  You get hemodialysis treatment.  You take certain medicines for conditions like cancer, organ transplantation, and autoimmune conditions.  Hepatitis C blood testing is recommended for all people born from [birth year] through 1965 and any individual with known risks for hepatitis C.  Practice safe sex. Use condoms and avoid high-risk sexual practices to reduce the spread of sexually transmitted infections (STIs). STIs include gonorrhea, chlamydia, syphilis, trichomonas, herpes, HPV, and human immunodeficiency virus (HIV). Herpes, HIV, and HPV are viral illnesses that have no cure. They can result in disability, cancer, and death.  You should be screened for sexually transmitted illnesses (STIs) including gonorrhea and chlamydia if:  You are sexually active and are younger than 24 years.  You are older than 24 years and your  health care provider tells you that you are at risk for this type of infection.  Your sexual activity has changed since you were last screened and you are at an increased risk for chlamydia or gonorrhea. Ask your health care provider if you are at risk.  If you are at risk of being infected with HIV, it is recommended that you take a prescription medicine daily to prevent HIV infection. This is called preexposure prophylaxis (PrEP). You are considered at risk if:  You are sexually active and do not regularly use condoms or know the HIV status of your partner(s).  You take drugs by injection.  You are sexually active with a partner who has HIV.  Talk with your health care provider about whether you are at high risk of being infected with HIV. If you choose to begin PrEP, you should first be tested for HIV. You should then be tested every 3 months for as long as you are taking PrEP.  Osteoporosis is a disease in which the bones lose minerals and strength with aging. This can result in serious bone fractures or breaks. The risk of osteoporosis can be identified using a bone density scan. Women ages 86 years and over and women at risk for fractures or osteoporosis should discuss screening with their health care providers. Ask your health care provider whether you should take a calcium supplement or vitamin D to reduce the rate of osteoporosis.  Menopause can be associated with physical symptoms and risks. Hormone replacement therapy is available to decrease symptoms and risks. You should talk to your health care provider about whether hormone replacement therapy is right for you.  Use sunscreen. Apply sunscreen liberally and repeatedly throughout the day. You should seek shade when your shadow is shorter than you. Protect yourself by wearing long sleeves, pants, a wide-brimmed hat, and sunglasses year round, whenever you are outdoors.  Once a month, do a whole body skin exam, using a mirror to look  at the skin on your back. Tell your health care provider of new moles, moles that have irregular borders, moles that are larger than a pencil eraser, or moles that have changed in shape or color.  Stay current with required vaccines (immunizations).  Influenza vaccine. All adults should be immunized every year.  Tetanus, diphtheria, and acellular pertussis (Td, Tdap) vaccine. Pregnant women should receive 1 dose of Tdap vaccine during each pregnancy. The dose should be obtained regardless of the length of time since the last dose. Immunization is preferred during the 27th-36th week of gestation. An adult who has not previously received Tdap or who does not know her vaccine status should receive 1 dose of Tdap. This initial dose should be followed by tetanus and diphtheria toxoids (Td) booster doses every 10 years. Adults with an unknown or incomplete history of completing a 3-dose immunization series with Td-containing vaccines should begin or complete a primary immunization series including a Tdap dose. Adults should receive a Td booster every 10 years.  Varicella vaccine. An adult without evidence of immunity to varicella should receive 2 doses or a second dose if she has previously received 1 dose. Pregnant females who do not have evidence of immunity should receive the first dose after pregnancy. This first dose should be obtained before leaving the health care facility. The second dose should be obtained 4-8 weeks after the first dose.  Human papillomavirus (HPV) vaccine. Females aged 13-26 years who have not received the vaccine previously should obtain the 3-dose series. The vaccine is not recommended for use in pregnant females. However, pregnancy testing is not needed before receiving a dose. If a female is found to be pregnant after receiving a dose, no treatment is needed. In that case, the remaining doses should be delayed until after the pregnancy. Immunization is recommended for any person  with an immunocompromised condition through the age of 37 years if she did not get any or all doses earlier. During the 3-dose series, the second dose should be obtained 4-8 weeks after the first dose. The third dose should be obtained 24 weeks after the first dose and 16 weeks after the second dose.  Zoster vaccine. One dose is recommended for adults  aged 61 years or older unless certain conditions are present.  Measles, mumps, and rubella (MMR) vaccine. Adults born before [birth year] generally are considered immune to measles and mumps. Adults born in [birth year] or later should have 1 or more doses of MMR vaccine unless there is a contraindication to the vaccine or there is laboratory evidence of immunity to each of the three diseases. A routine second dose of MMR vaccine should be obtained at least 28 days after the first dose for students attending postsecondary schools, health care workers, or international travelers. People who received inactivated measles vaccine or an unknown type of measles vaccine during 1963-1967 should receive 2 doses of MMR vaccine. People who received inactivated mumps vaccine or an unknown type of mumps vaccine before 1979 and are at high risk for mumps infection should consider immunization with 2 doses of MMR vaccine. For females of childbearing age, rubella immunity should be determined. If there is no evidence of immunity, females who are not pregnant should be vaccinated. If there is no evidence of immunity, females who are pregnant should delay immunization until after pregnancy. Unvaccinated health care workers born before [birth year] who lack laboratory evidence of measles, mumps, or rubella immunity or laboratory confirmation of disease should consider measles and mumps immunization with 2 doses of MMR vaccine or rubella immunization with 1 dose of MMR vaccine.  Pneumococcal 13-valent conjugate (PCV13) vaccine. When indicated, a person who is uncertain of his immunization history and has  no record of immunization should receive the PCV13 vaccine. All adults 78 years of age and older should receive this vaccine. An adult aged 103 years or older who has certain medical conditions and has not been previously immunized should receive 1 dose of PCV13 vaccine. This PCV13 should be followed with a dose of pneumococcal polysaccharide (PPSV23) vaccine. Adults who are at high risk for pneumococcal disease should obtain the PPSV23 vaccine at least 8 weeks after the dose of PCV13 vaccine. Adults older than 54 years of age who have normal immune system function should obtain the PPSV23 vaccine dose at least 1 year after the dose of PCV13 vaccine.  Pneumococcal polysaccharide (PPSV23) vaccine. When PCV13 is also indicated, PCV13 should be obtained first. All adults aged 62 years and older should be immunized. An adult younger than age 78 years who has certain medical conditions should be immunized. Any person who resides in a nursing home or long-term care facility should be immunized. An adult smoker should be immunized. People with an immunocompromised condition and certain other conditions should receive both PCV13 and PPSV23 vaccines. People with human immunodeficiency virus (HIV) infection should be immunized as soon as possible after diagnosis. Immunization during chemotherapy or radiation therapy should be avoided. Routine use of PPSV23 vaccine is not recommended for American Indians, [place] Natives, or people younger than 65 years unless there are medical conditions that require PPSV23 vaccine. When indicated, people who have unknown immunization and have no record of immunization should receive PPSV23 vaccine. One-time revaccination 5 years after the first dose of PPSV23 is recommended for people aged 19-64 years who have chronic kidney failure, nephrotic syndrome, asplenia, or immunocompromised conditions. People who received 1-2 doses of PPSV23 before age 39 years should receive another dose of PPSV23  vaccine at age 86 years or later if at least 5 years have passed since the previous dose. Doses of PPSV23 are not needed for people immunized with PPSV23 at or after age 96 years.  Meningococcal vaccine. Adults with asplenia or  persistent complement component deficiencies should receive 2 doses of quadrivalent meningococcal conjugate (MenACWY-D) vaccine. The doses should be obtained at least 2 months apart. Microbiologists working with certain meningococcal bacteria, [place] recruits, people at risk during an outbreak, and people who travel to or live in countries with a high rate of meningitis should be immunized. A first-year college student up through age 34 years who is living in a residence hall should receive a dose if she did not receive a dose on or after her 16th birthday. Adults who have certain high-risk conditions should receive one or more doses of vaccine.  Hepatitis A vaccine. Adults who wish to be protected from this disease, have certain high-risk conditions, work with hepatitis A-infected animals, work in hepatitis A research labs, or travel to or work in countries with a high rate of hepatitis A should be immunized. Adults who were previously unvaccinated and who anticipate close contact with an international adoptee during the first 60 days after arrival in the [place] from a country with a high rate of hepatitis A should be immunized.  Hepatitis B vaccine. Adults who wish to be protected from this disease, have certain high-risk conditions, may be exposed to blood or other infectious body fluids, are household contacts or sex partners of hepatitis B positive people, are clients or workers in certain care facilities, or travel to or work in countries with a high rate of hepatitis B should be immunized.  Haemophilus influenzae type b (Hib) vaccine. A previously unvaccinated person with asplenia or sickle cell disease or having a scheduled splenectomy should receive 1 dose of Hib  vaccine. Regardless of previous immunization, a recipient of a hematopoietic stem cell transplant should receive a 3-dose series 6-12 months after her successful transplant. Hib vaccine is not recommended for adults with HIV infection. Preventive Services / Frequency Ages 35 to 23 years  Blood pressure check.** / Every 3-5 years.  Lipid and cholesterol check.** / Every 5 years beginning at age 65.  Clinical breast exam.** / Every 3 years for women in their 84s and 89s.  BRCA-related cancer risk assessment.** / For women who have family members with a BRCA-related cancer (breast, ovarian, tubal, or peritoneal cancers).  Pap test.** / Every 2 years from ages 40 through 53. Every 3 years starting at age 48 through age 94 or 96 with a history of 3 consecutive normal Pap tests.  HPV screening.** / Every 3 years from ages 73 through ages 7 to 25 with a history of 3 consecutive normal Pap tests.  Hepatitis C blood test.** / For any individual with known risks for hepatitis C.  Skin self-exam. / Monthly.  Influenza vaccine. / Every year.  Tetanus, diphtheria, and acellular pertussis (Tdap, Td) vaccine.** / Consult your health care provider. Pregnant women should receive 1 dose of Tdap vaccine during each pregnancy. 1 dose of Td every 10 years.  Varicella vaccine.** / Consult your health care provider. Pregnant females who do not have evidence of immunity should receive the first dose after pregnancy.  HPV vaccine. / 3 doses over 6 months, if 91 and younger. The vaccine is not recommended for use in pregnant females. However, pregnancy testing is not needed before receiving a dose.  Measles, mumps, rubella (MMR) vaccine.** / You need at least 1 dose of MMR if you were born in 1957 or later. You may also need a 2nd dose. For females of childbearing age, rubella immunity should be determined. If there is no evidence of  immunity, females who are not pregnant should be vaccinated. If there is no  evidence of immunity, females who are pregnant should delay immunization until after pregnancy.  Pneumococcal 13-valent conjugate (PCV13) vaccine.** / Consult your health care provider.  Pneumococcal polysaccharide (PPSV23) vaccine.** / 1 to 2 doses if you smoke cigarettes or if you have certain conditions.  Meningococcal vaccine.** / 1 dose if you are age 8 to 37 years and a Market researcher living in a residence hall, or have one of several medical conditions, you need to get vaccinated against meningococcal disease. You may also need additional booster doses.  Hepatitis A vaccine.** / Consult your health care provider.  Hepatitis B vaccine.** / Consult your health care provider.  Haemophilus influenzae type b (Hib) vaccine.** / Consult your health care provider. Ages 12 to 80 years  Blood pressure check.** / Every year.  Lipid and cholesterol check.** / Every 5 years beginning at age 29 years.  Lung cancer screening. / Every year if you are aged 56-80 years and have a 30-pack-year history of smoking and currently smoke or have quit within the past 15 years. Yearly screening is stopped once you have quit smoking for at least 15 years or develop a health problem that would prevent you from having lung cancer treatment.  Clinical breast exam.** / Every year after age 90 years.  BRCA-related cancer risk assessment.** / For women who have family members with a BRCA-related cancer (breast, ovarian, tubal, or peritoneal cancers).  Mammogram.** / Every year beginning at age 67 years and continuing for as long as you are in good health. Consult with your health care provider.  Pap test.** / Every 3 years starting at age 63 years through age 50 or 69 years with a history of 3 consecutive normal Pap tests.  HPV screening.** / Every 3 years from ages 49 years through ages 51 to 41 years with a history of 3 consecutive normal Pap tests.  Fecal occult blood test (FOBT) of stool. /  Every year beginning at age 52 years and continuing until age 29 years. You may not need to do this test if you get a colonoscopy every 10 years.  Flexible sigmoidoscopy or colonoscopy.** / Every 5 years for a flexible sigmoidoscopy or every 10 years for a colonoscopy beginning at age 45 years and continuing until age 90 years.  Hepatitis C blood test.** / For all people born from [birth year] through 1965 and any individual with known risks for hepatitis C.  Skin self-exam. / Monthly.  Influenza vaccine. / Every year.  Tetanus, diphtheria, and acellular pertussis (Tdap/Td) vaccine.** / Consult your health care provider. Pregnant women should receive 1 dose of Tdap vaccine during each pregnancy. 1 dose of Td every 10 years.  Varicella vaccine.** / Consult your health care provider. Pregnant females who do not have evidence of immunity should receive the first dose after pregnancy.  Zoster vaccine.** / 1 dose for adults aged 4 years or older.  Measles, mumps, rubella (MMR) vaccine.** / You need at least 1 dose of MMR if you were born in 1957 or later. You may also need a second dose. For females of childbearing age, rubella immunity should be determined. If there is no evidence of immunity, females who are not pregnant should be vaccinated. If there is no evidence of immunity, females who are pregnant should delay immunization until after pregnancy.  Pneumococcal 13-valent conjugate (PCV13) vaccine.** / Consult your health care provider.  Pneumococcal polysaccharide (PPSV23) vaccine.** /  1 to 2 doses if you smoke cigarettes or if you have certain conditions.  Meningococcal vaccine.** / Consult your health care provider.  Hepatitis A vaccine.** / Consult your health care provider.  Hepatitis B vaccine.** / Consult your health care provider.  Haemophilus influenzae type b (Hib) vaccine.** / Consult your health care provider. Ages 41 years and over  Blood pressure check.** / Every year.  Lipid  and cholesterol check.** / Every 5 years beginning at age 16 years.  Lung cancer screening. / Every year if you are aged 53-80 years and have a 30-pack-year history of smoking and currently smoke or have quit within the past 15 years. Yearly screening is stopped once you have quit smoking for at least 15 years or develop a health problem that would prevent you from having lung cancer treatment.  Clinical breast exam.** / Every year after age 100 years.  BRCA-related cancer risk assessment.** / For women who have family members with a BRCA-related cancer (breast, ovarian, tubal, or peritoneal cancers).  Mammogram.** / Every year beginning at age 65 years and continuing for as long as you are in good health. Consult with your health care provider.  Pap test.** / Every 3 years starting at age 75 years through age 81 or 65 years with 3 consecutive normal Pap tests. Testing can be stopped between 65 and 70 years with 3 consecutive normal Pap tests and no abnormal Pap or HPV tests in the past 10 years.  HPV screening.** / Every 3 years from ages 53 years through ages 41 or 56 years with a history of 3 consecutive normal Pap tests. Testing can be stopped between 65 and 70 years with 3 consecutive normal Pap tests and no abnormal Pap or HPV tests in the past 10 years.  Fecal occult blood test (FOBT) of stool. / Every year beginning at age 66 years and continuing until age 93 years. You may not need to do this test if you get a colonoscopy every 10 years.  Flexible sigmoidoscopy or colonoscopy.** / Every 5 years for a flexible sigmoidoscopy or every 10 years for a colonoscopy beginning at age 79 years and continuing until age 56 years.  Hepatitis C blood test.** / For all people born from [birth year] through 1965 and any individual with known risks for hepatitis C.  Osteoporosis screening.** / A one-time screening for women ages 46 years and over and women at risk for fractures or osteoporosis.  Skin  self-exam. / Monthly.  Influenza vaccine. / Every year.  Tetanus, diphtheria, and acellular pertussis (Tdap/Td) vaccine.** / 1 dose of Td every 10 years.  Varicella vaccine.** / Consult your health care provider.  Zoster vaccine.** / 1 dose for adults aged 52 years or older.  Pneumococcal 13-valent conjugate (PCV13) vaccine.** / Consult your health care provider.  Pneumococcal polysaccharide (PPSV23) vaccine.** / 1 dose for all adults aged 4 years and older.  Meningococcal vaccine.** / Consult your health care provider.  Hepatitis A vaccine.** / Consult your health care provider.  Hepatitis B vaccine.** / Consult your health care provider.  Haemophilus influenzae type b (Hib) vaccine.** / Consult your health care provider. ** Family history and personal history of risk and conditions may change your health care provider's recommendations.   This information is not intended to replace advice given to you by your health care provider. Make sure you discuss any questions you have with your health care provider.   Document Released: 01/27/2002 Document Revised: 12/22/2014 Document Reviewed: 04/28/2011 Elsevier  Interactive Patient Education [employer].

## 2015-10-19 NOTE — Assessment & Plan Note (Signed)
Pap today, no concerns on exam.  

## 2015-10-20 LAB — URINE CULTURE
Colony Count: NO GROWTH
Organism ID, Bacteria: NO GROWTH

## 2015-10-21 ENCOUNTER — Other Ambulatory Visit: Payer: Self-pay | Admitting: Family Medicine

## 2015-10-21 DIAGNOSIS — R1031 Right lower quadrant pain: Secondary | ICD-10-CM

## 2015-10-22 ENCOUNTER — Ambulatory Visit: Payer: 59 | Admitting: Family Medicine

## 2015-10-22 ENCOUNTER — Telehealth: Payer: Self-pay | Admitting: Family Medicine

## 2015-10-22 LAB — CYTOLOGY - PAP

## 2015-10-22 NOTE — Telephone Encounter (Signed)
No can we switch referral to Dr Tamala Julian at [facility] for Sports med please?

## 2015-10-22 NOTE — Telephone Encounter (Signed)
Pt was scheduled for sports med with Dr. Barbaraann Barthel and she said that her and Dr. Charlett Blake discussed seeing Hulan Saas at [facility] for sports med. Does a new referral need entered or can she call [facility] office to schedule. She is calling to cancel appt with Dr. Barbaraann Barthel.

## 2015-10-23 ENCOUNTER — Ambulatory Visit (HOSPITAL_COMMUNITY)
Admission: RE | Admit: 2015-10-23 | Discharge: 2015-10-23 | Disposition: A | Discharge status: Home / Self Care | Payer: 59 | Source: Ambulatory Visit | Attending: Family Medicine | Admitting: Family Medicine

## 2015-10-23 DIAGNOSIS — R1031 Right lower quadrant pain: Secondary | ICD-10-CM | POA: Insufficient documentation

## 2015-10-23 DIAGNOSIS — R102 Pelvic and perineal pain: Secondary | ICD-10-CM | POA: Insufficient documentation

## 2015-10-23 DIAGNOSIS — Z780 Asymptomatic menopausal state: Secondary | ICD-10-CM | POA: Insufficient documentation

## 2015-10-23 DIAGNOSIS — M7989 Other specified soft tissue disorders: Secondary | ICD-10-CM

## 2015-10-23 NOTE — Telephone Encounter (Signed)
appt with Dr Tamala Julian on 11/17, lm on vm for patient to return call

## 2015-10-28 ENCOUNTER — Encounter: Payer: Self-pay | Admitting: Family Medicine

## 2015-10-28 DIAGNOSIS — R1031 Right lower quadrant pain: Secondary | ICD-10-CM | POA: Insufficient documentation

## 2015-10-28 DIAGNOSIS — M25551 Pain in right hip: Secondary | ICD-10-CM

## 2015-10-28 DIAGNOSIS — M545 Low back pain, unspecified: Secondary | ICD-10-CM | POA: Insufficient documentation

## 2015-10-28 DIAGNOSIS — E782 Mixed hyperlipidemia: Secondary | ICD-10-CM | POA: Insufficient documentation

## 2015-10-28 DIAGNOSIS — M7989 Other specified soft tissue disorders: Secondary | ICD-10-CM | POA: Insufficient documentation

## 2015-10-28 HISTORY — DX: Pain in right hip: M25.551

## 2015-10-28 NOTE — Assessment & Plan Note (Signed)
Off and on for 8-9 years. Recently more persistent and worse with activity, causing trouble with sleep, referred to sports med for evaluaiton. Encouraged moist heat and gentle stretching as tolerated. May try NSAIDs and prescription meds as directed and report if symptoms worsen or seek immediate care

## 2015-10-28 NOTE — Assessment & Plan Note (Signed)
Mild DDD confirmed. Encouraged moist heat and gentle stretching as tolerated. May try NSAIDs and prescription meds as directed and report if symptoms worsen or seek immediate care

## 2015-10-28 NOTE — Assessment & Plan Note (Signed)
Continue supplements

## 2015-10-28 NOTE — Progress Notes (Signed)
Subjective:    Patient ID: Toni Boone, female    DOB: [birth year], 54 y.o.   MRN: [id number]  Chief Complaint  Patient presents with  . Annual Exam    HPI Patient is in today for annual exam. She notes since starting levothyroxine her energy level and her mood are improved. She feels she's handling her stress and anxiety better. Has not needed alprazolam. No recent illness or acute concerns. Reports that 8 or 9 year history of intermittent right hip pain more recently has become more persistent. She twisted her leg while playing basketball and has had trouble to her low back and her hip. She describes trouble sleeping but also ambulating. No radicular symptoms. Is noting a lesion in her inner right thigh which is been present for years but she believes has recently enlarged as well. Nontender. No warmth. No other acute concerns. Denies CP/palp/SOB/HA/congestion/fevers/GI or GU c/o. Taking meds as prescribed  Past Medical History  Diagnosis Date  . Chicken pox as a child  . Anxiety   . Cancer ([place]) 2012    basal cell- left shoulder  . Actinic keratosis   . Vitamin D deficiency 2013  . Vitamin D deficiency   . Fibrocystic breast   . IBS (irritable bowel syndrome) 04/20/2013  . Cervical cancer screening 04/24/2013    Has a history of a cervical polyp found on evaluation of some cellular changes, D&C revealed only the polyp LMP 3/14  . Other and unspecified hyperlipidemia 06/02/2013  . Preventative health care 06/02/2013  . Unspecified hypothyroidism 08/26/2014  . Abdominal pain, epigastric 04/15/2015  . Light headedness 04/15/2015  . Right hip pain 10/28/2015    Past Surgical History  Procedure Laterality Date  . Basal cell removal  2012    left shoulder  . Cholecystectomy  11-12  . Tonsillectomy  as a child  . Cesarean section  89 and 89  . Breast surgery  1996 and 2012    left breast- benign  . Axillary lymph node biopsy  80's    left  . Hemorrhoid surgery  1997    Family  History  Problem Relation Age of Onset  . Hypertension Mother   . Cancer Mother 102    non hodgkin lymphoma, basal, scc  . Other Mother     bilateral renal stenosis  . Cancer Father     pituitary adenoma, basal  . Stroke Father 66  . Aneurysm Father 65    brain  . Cancer Brother     basal   . Hypertension Maternal Grandmother   . Heart attack Maternal Grandmother   . Other Maternal Grandmother     ras  . Heart attack Maternal Grandfather   . Stroke Paternal Grandmother   . Emphysema Paternal Grandfather     smoker  . COPD Paternal Grandfather     smoker  . Other Maternal Aunt     RAS/RAS  . Hypertension Maternal Aunt   . Colon cancer Neg Hx     Social History   Social History  . Marital Status: Married    Spouse Name: N/A  . Number of Children: 2  . Years of Education: master's   Occupational History  . [employer]    Social History Main Topics  . Smoking status: Never Smoker   . Smokeless tobacco: Never Used  . Alcohol Use: 2.4 oz/week    4 Glasses of wine per week  . Drug Use: No  . Sexual Activity:  Partners: Male     Comment: lives with husband, no dietary restrictions, works with [employer]   Other Topics Concern  . Not on file   Social History Narrative    Outpatient Prescriptions Prior to Visit  Medication Sig Dispense Refill  . ALPRAZolam (XANAX) 0.5 MG tablet Take 0.5-1 tablets (0.25-0.5 mg total) by mouth daily as needed for anxiety. 30 tablet 0  . levothyroxine (SYNTHROID, LEVOTHROID) 50 MCG tablet Take 1 tablet (50 mcg total) by mouth daily. 30 tablet 3  . Multiple Vitamin (MULTIVITAMIN WITH MINERALS) TABS tablet Take 1 tablet by mouth daily.    . ranitidine (ZANTAC) 300 MG tablet Take 1 tablet (300 mg total) by mouth daily as needed for heartburn. 30 tablet 3  . tretinoin (RETIN-A) 0.05 % cream Apply topically at bedtime. 45 g 8  . OVER THE COUNTER MEDICATION Apply 1 application topically 3 (three) times a week. OTC progesterone cream.     No  facility-administered medications prior to visit.    Allergies  Allergen Reactions  . Avelox [Moxifloxacin Hcl In Nacl] Anaphylaxis  . Penicillins Anaphylaxis  . Codeine     GI upset  . Macrodantin [Nitrofurantoin Macrocrystal]     Burning throat  . Aspirin Diarrhea, Nausea Only and Anxiety    Review of Systems  Constitutional: Negative for fever and malaise/fatigue.  HENT: Negative for congestion.   Eyes: Negative for discharge.  Respiratory: Negative for shortness of breath.   Cardiovascular: Negative for chest pain, palpitations and leg swelling.  Gastrointestinal: Negative for nausea and abdominal pain.  Genitourinary: Negative for dysuria.  Musculoskeletal: Positive for back pain and joint pain. Negative for falls.  Skin: Negative for rash.  Neurological: Negative for loss of consciousness and headaches.  Endo/Heme/Allergies: Negative for environmental allergies.  Psychiatric/Behavioral: Negative for depression. The patient is not nervous/anxious.        Objective:    Physical Exam  Constitutional: She is oriented to person, place, and time. She appears well-developed and well-nourished. No distress.  HENT:  Head: Normocephalic and atraumatic.  Eyes: Conjunctivae are normal.  Neck: Neck supple. No thyromegaly present.  Cardiovascular: Normal rate, regular rhythm and normal heart sounds.   No murmur heard. Pulmonary/Chest: Effort normal and breath sounds normal. No respiratory distress.  Abdominal: Soft. Bowel sounds are normal. She exhibits no distension and no mass. There is no tenderness.  Genitourinary:  No vulvar or vaginal lesions, no cervical lesions. No abnormalities on pelvic exam  Musculoskeletal: She exhibits no edema.  Inner, upper, right thigh 1x 2 cm firm mobile lesion, nontender  Lymphadenopathy:    She has no cervical adenopathy.  Neurological: She is alert and oriented to person, place, and time.  Skin: Skin is warm and dry.  Psychiatric: She has  a normal mood and affect. Her behavior is normal.    BP 120/82 mmHg  Pulse 65  Temp(Src) 97.9 F (36.6 C) (Oral)  Ht 5\' 5"  (1.651 m)  Wt 149 lb (67.586 kg)  BMI 24.79 kg/m2  SpO2 97%  LMP 11/14/2013 Wt Readings from Last 3 Encounters:  10/19/15 149 lb (67.586 kg)  07/24/15 150 lb (68.04 kg)  04/10/15 151 lb 4 oz (68.607 kg)     Lab Results  Component Value Date   WBC 6.1 10/19/2015   HGB 13.0 10/19/2015   HCT 38.8 10/19/2015   PLT 249.0 10/19/2015   GLUCOSE 91 10/19/2015   CHOL 186 10/19/2015   TRIG 83.0 10/19/2015   HDL 48.80 10/19/2015   LDLCALC  121* 10/19/2015   ALT 27 10/19/2015   AST 22 10/19/2015   NA 139 10/19/2015   K 4.2 10/19/2015   CL 104 10/19/2015   CREATININE 0.85 10/19/2015   BUN 11 10/19/2015   CO2 30 10/19/2015   TSH 3.39 10/19/2015    Lab Results  Component Value Date   TSH 3.39 10/19/2015   Lab Results  Component Value Date   WBC 6.1 10/19/2015   HGB 13.0 10/19/2015   HCT 38.8 10/19/2015   MCV 92.4 10/19/2015   PLT 249.0 10/19/2015   Lab Results  Component Value Date   NA 139 10/19/2015   K 4.2 10/19/2015   CO2 30 10/19/2015   GLUCOSE 91 10/19/2015   BUN 11 10/19/2015   CREATININE 0.85 10/19/2015   BILITOT 0.5 10/19/2015   ALKPHOS 59 10/19/2015   AST 22 10/19/2015   ALT 27 10/19/2015   PROT 7.4 10/19/2015   ALBUMIN 4.3 10/19/2015   CALCIUM 10.0 10/19/2015   ANIONGAP 14 07/28/2014   GFR 73.99 10/19/2015   Lab Results  Component Value Date   CHOL 186 10/19/2015   Lab Results  Component Value Date   HDL 48.80 10/19/2015   Lab Results  Component Value Date   LDLCALC 121* 10/19/2015   Lab Results  Component Value Date   TRIG 83.0 10/19/2015   Lab Results  Component Value Date   CHOLHDL 4 10/19/2015   No results found for: HGBA1C     Assessment & Plan:   Problem List Items Addressed This Visit    Vitamin D deficiency    Continue supplements      Cervical cancer screening - Primary    Pap today, no  concerns on exam.       Relevant Orders   Cytology - PAP (Completed)   TSH (Completed)   CBC (Completed)   Lipid panel (Completed)   Comprehensive metabolic panel (Completed)   Urinalysis (Completed)   Urine culture (Completed)   Preventative health care    Patient encouraged to maintain heart healthy diet, regular exercise, adequate sleep. Consider daily probiotics. Take medications as prescribed Patient encouraged to maintain heart healthy diet, regular exercise, adequate sleep. Consider daily probiotics. Take medications as prescribed      Relevant Orders   TSH (Completed)   CBC (Completed)   Lipid panel (Completed)   Comprehensive metabolic panel (Completed)   Urinalysis (Completed)   Urine culture (Completed)   Hypothyroidism    Feeling better since initiating treatments      Relevant Orders   TSH (Completed)   CBC (Completed)   Lipid panel (Completed)   Comprehensive metabolic panel (Completed)   Urinalysis (Completed)   Urine culture (Completed)   Soft tissue mass    Right upper inner thigh, enlarging and mildly tender per patient. Ultrasound does not reveal any concerning characteristics. If develops new symptoms or enlarges may proceed with excision vs further imaging.      Relevant Orders   [place] Misc Soft Tissue (Completed)   Low back pain    Mild DDD confirmed. Encouraged moist heat and gentle stretching as tolerated. May try NSAIDs and prescription meds as directed and report if symptoms worsen or seek immediate care      Relevant Medications   cyclobenzaprine (FLEXERIL) 10 MG tablet   Other Relevant Orders   DG Lumbar Spine Complete (Completed)   Ambulatory referral to Sports Medicine   TSH (Completed)   CBC (Completed)   Lipid panel (Completed)   Comprehensive metabolic  panel (Completed)   Urinalysis (Completed)   Urine culture (Completed)   DG HIP UNILAT WITH PELVIS 2-3 VIEWS RIGHT (Completed)   Hyperlipidemia, mixed    encouraged heart healthy  diet, avoid trans fats, minimize simple carbs and saturated fats. Increase exercise as tolerated      Relevant Orders   TSH (Completed)   CBC (Completed)   Lipid panel (Completed)   Comprehensive metabolic panel (Completed)   Urinalysis (Completed)   Urine culture (Completed)   RLQ abdominal pain    Proceed with pelvic ultrasound, patient to report if symtpoms worsen.      Relevant Orders   TSH (Completed)   CBC (Completed)   Lipid panel (Completed)   Comprehensive metabolic panel (Completed)   Urinalysis (Completed)   Urine culture (Completed)   US Pelvis Complete (Completed)   Right hip pain    Off and on for 8-9 years. Recently more persistent and worse with activity, causing trouble with sleep, referred to sports med for evaluaiton. Encouraged moist heat and gentle stretching as tolerated. May try NSAIDs and prescription meds as directed and report if symptoms worsen or seek immediate care       Other Visit Diagnoses    Hip pain, right        Relevant Orders    DG Lumbar Spine Complete (Completed)    Ambulatory referral to Sports Medicine    TSH (Completed)    CBC (Completed)    Lipid panel (Completed)    Comprehensive metabolic panel (Completed)    Urinalysis (Completed)    Urine culture (Completed)    DG HIP UNILAT WITH PELVIS 2-3 VIEWS RIGHT (Completed)       I have discontinued Ms. Langan's OVER THE COUNTER MEDICATION. I am also having her start on cyclobenzaprine. Additionally, I am having her maintain her multivitamin with minerals, tretinoin, ranitidine, ALPRAZolam, and levothyroxine.  Meds ordered this encounter  Medications  . cyclobenzaprine (FLEXERIL) 10 MG tablet    Sig: Take 1 tablet (10 mg total) by mouth at bedtime as needed for muscle spasms.    Dispense:  30 tablet    Refill:  1     Penni Homans, MD

## 2015-10-28 NOTE — Assessment & Plan Note (Signed)
Patient encouraged to maintain heart healthy diet, regular exercise, adequate sleep. Consider daily probiotics. Take medications as prescribed. Patient encouraged to maintain heart healthy diet, regular exercise, adequate sleep. Consider daily probiotics. Take medications as prescribed 

## 2015-10-28 NOTE — Assessment & Plan Note (Signed)
Feeling better since initiating treatments

## 2015-10-28 NOTE — Assessment & Plan Note (Signed)
Right upper inner thigh, enlarging and mildly tender per patient. Ultrasound does not reveal any concerning characteristics. If develops new symptoms or enlarges may proceed with excision vs further imaging.

## 2015-10-28 NOTE — Assessment & Plan Note (Addendum)
encouraged heart healthy diet, avoid trans fats, minimize simple carbs and saturated fats. Increase exercise as tolerated 

## 2015-10-28 NOTE — Assessment & Plan Note (Signed)
Proceed with pelvic ultrasound, patient to report if symtpoms worsen.

## 2015-11-01 ENCOUNTER — Ambulatory Visit: Payer: 59 | Admitting: Family Medicine

## 2015-11-13 ENCOUNTER — Other Ambulatory Visit: Payer: Self-pay

## 2015-11-13 DIAGNOSIS — Z1231 Encounter for screening mammogram for malignant neoplasm of breast: Secondary | ICD-10-CM

## 2015-12-18 ENCOUNTER — Ambulatory Visit: Payer: 59

## 2015-12-27 ENCOUNTER — Other Ambulatory Visit: Payer: Self-pay | Admitting: Family Medicine

## 2015-12-27 ENCOUNTER — Encounter: Payer: Self-pay | Admitting: Family Medicine

## 2015-12-27 ENCOUNTER — Ambulatory Visit
Admission: RE | Admit: 2015-12-27 | Discharge: 2015-12-27 | Disposition: A | Discharge status: Home / Self Care | Payer: 59 | Source: Ambulatory Visit

## 2015-12-27 DIAGNOSIS — Z1231 Encounter for screening mammogram for malignant neoplasm of breast: Secondary | ICD-10-CM

## 2015-12-27 DIAGNOSIS — R928 Other abnormal and inconclusive findings on diagnostic imaging of breast: Secondary | ICD-10-CM

## 2015-12-27 DIAGNOSIS — L989 Disorder of the skin and subcutaneous tissue, unspecified: Secondary | ICD-10-CM

## 2015-12-28 ENCOUNTER — Ambulatory Visit
Admission: RE | Admit: 2015-12-28 | Discharge: 2015-12-28 | Disposition: A | Discharge status: Home / Self Care | Payer: 59 | Source: Ambulatory Visit | Attending: Family Medicine | Admitting: Family Medicine

## 2015-12-28 DIAGNOSIS — R928 Other abnormal and inconclusive findings on diagnostic imaging of breast: Secondary | ICD-10-CM

## 2015-12-28 DIAGNOSIS — R922 Inconclusive mammogram: Secondary | ICD-10-CM | POA: Diagnosis not present

## 2015-12-31 MED FILL — ALPRAZolam 0.5 MG TABS: 0.5 | 30 days supply | Qty: 30 | Fill #0

## 2015-12-31 MED FILL — LEVOTHYROXINE 50 MCG TABLET: 50 | 30 days supply | Qty: 30 | Fill #1

## 2016-01-02 ENCOUNTER — Other Ambulatory Visit: Payer: 59

## 2016-01-09 DIAGNOSIS — R2241 Localized swelling, mass and lump, right lower limb: Secondary | ICD-10-CM | POA: Diagnosis not present

## 2016-01-22 ENCOUNTER — Encounter (HOSPITAL_BASED_OUTPATIENT_CLINIC_OR_DEPARTMENT_OTHER): Payer: Self-pay | Admitting: *Deleted

## 2016-01-24 ENCOUNTER — Other Ambulatory Visit: Payer: Self-pay | Admitting: General Surgery

## 2016-01-28 ENCOUNTER — Ambulatory Visit (HOSPITAL_BASED_OUTPATIENT_CLINIC_OR_DEPARTMENT_OTHER): Payer: 59 | Admitting: Anesthesiology

## 2016-01-28 ENCOUNTER — Ambulatory Visit (HOSPITAL_BASED_OUTPATIENT_CLINIC_OR_DEPARTMENT_OTHER)
Admission: RE | Admit: 2016-01-28 | Discharge: 2016-01-28 | Disposition: A | Discharge status: Home / Self Care | Payer: 59 | Source: Ambulatory Visit | Attending: General Surgery | Admitting: General Surgery

## 2016-01-28 ENCOUNTER — Encounter (HOSPITAL_BASED_OUTPATIENT_CLINIC_OR_DEPARTMENT_OTHER)
Admission: RE | Disposition: A | Discharge status: Home / Self Care | Payer: Self-pay | Source: Ambulatory Visit | Attending: General Surgery

## 2016-01-28 ENCOUNTER — Encounter (HOSPITAL_BASED_OUTPATIENT_CLINIC_OR_DEPARTMENT_OTHER): Payer: Self-pay | Admitting: *Deleted

## 2016-01-28 DIAGNOSIS — Z79899 Other long term (current) drug therapy: Secondary | ICD-10-CM | POA: Diagnosis not present

## 2016-01-28 DIAGNOSIS — K219 Gastro-esophageal reflux disease without esophagitis: Secondary | ICD-10-CM | POA: Insufficient documentation

## 2016-01-28 DIAGNOSIS — R229 Localized swelling, mass and lump, unspecified: Secondary | ICD-10-CM | POA: Diagnosis not present

## 2016-01-28 DIAGNOSIS — D1723 Benign lipomatous neoplasm of skin and subcutaneous tissue of right leg: Secondary | ICD-10-CM | POA: Diagnosis not present

## 2016-01-28 DIAGNOSIS — E039 Hypothyroidism, unspecified: Secondary | ICD-10-CM | POA: Diagnosis not present

## 2016-01-28 DIAGNOSIS — F419 Anxiety disorder, unspecified: Secondary | ICD-10-CM | POA: Insufficient documentation

## 2016-01-28 DIAGNOSIS — R2241 Localized swelling, mass and lump, right lower limb: Secondary | ICD-10-CM | POA: Diagnosis not present

## 2016-01-28 HISTORY — PX: MASS EXCISION: SHX2000

## 2016-01-28 HISTORY — DX: Gastro-esophageal reflux disease without esophagitis: K21.9

## 2016-01-28 SURGERY — EXCISION MASS
Anesthesia: General | Site: Thigh | Laterality: Right

## 2016-01-28 MED ORDER — KETOROLAC TROMETHAMINE 30 MG/ML IJ SOLN
INTRAMUSCULAR | Status: AC
  Filled 2016-01-28: qty 1

## 2016-01-28 MED ORDER — ONDANSETRON HCL 4 MG/2ML IJ SOLN
INTRAMUSCULAR | Status: DC | PRN
  Administered 2016-01-28: 4 mg via INTRAVENOUS

## 2016-01-28 MED ORDER — LIDOCAINE HCL (CARDIAC) 20 MG/ML IV SOLN
INTRAVENOUS | Status: DC | PRN
  Administered 2016-01-28: 80 mg via INTRAVENOUS

## 2016-01-28 MED ORDER — PROPOFOL 10 MG/ML IV BOLUS
INTRAVENOUS | Status: DC | PRN
  Administered 2016-01-28: 175 mg via INTRAVENOUS

## 2016-01-28 MED ORDER — GLYCOPYRROLATE 0.2 MG/ML IJ SOLN: 0.2000 mg | Freq: Once | INTRAMUSCULAR | Status: DC | PRN

## 2016-01-28 MED ORDER — SODIUM CHLORIDE 0.9 % IJ SOLN
INTRAMUSCULAR | Status: AC
  Filled 2016-01-28: qty 10

## 2016-01-28 MED ORDER — HYDROCODONE-ACETAMINOPHEN 10-325 MG PO TABS: 1.0000 | ORAL_TABLET | Freq: Four times a day (QID) | ORAL | Status: DC | PRN

## 2016-01-28 MED ORDER — MIDAZOLAM HCL 2 MG/2ML IJ SOLN
INTRAMUSCULAR | Status: AC
  Filled 2016-01-28: qty 2

## 2016-01-28 MED ORDER — LACTATED RINGERS IV SOLN
INTRAVENOUS | Status: DC
  Administered 2016-01-28: 07:00:00 via INTRAVENOUS

## 2016-01-28 MED ORDER — MIDAZOLAM HCL 2 MG/2ML IJ SOLN
1.0000 mg | INTRAMUSCULAR | Status: DC | PRN
  Administered 2016-01-28: 2 mg via INTRAVENOUS

## 2016-01-28 MED ORDER — ONDANSETRON HCL 4 MG/2ML IJ SOLN
INTRAMUSCULAR | Status: AC
  Filled 2016-01-28: qty 2

## 2016-01-28 MED ORDER — SCOPOLAMINE 1 MG/3DAYS TD PT72: 1.0000 | MEDICATED_PATCH | Freq: Once | TRANSDERMAL | Status: DC | PRN

## 2016-01-28 MED ORDER — FENTANYL CITRATE (PF) 100 MCG/2ML IJ SOLN
50.0000 ug | INTRAMUSCULAR | Status: DC | PRN
  Administered 2016-01-28: 50 ug via INTRAVENOUS

## 2016-01-28 MED ORDER — PROMETHAZINE HCL 25 MG/ML IJ SOLN: 6.2500 mg | INTRAMUSCULAR | Status: DC | PRN

## 2016-01-28 MED ORDER — PROPOFOL 500 MG/50ML IV EMUL
INTRAVENOUS | Status: AC
  Filled 2016-01-28: qty 50

## 2016-01-28 MED ORDER — FENTANYL CITRATE (PF) 100 MCG/2ML IJ SOLN
INTRAMUSCULAR | Status: AC
  Filled 2016-01-28: qty 2

## 2016-01-28 MED ORDER — BUPIVACAINE HCL (PF) 0.25 % IJ SOLN
INTRAMUSCULAR | Status: DC | PRN
  Administered 2016-01-28: 2 mL

## 2016-01-28 MED ORDER — BUPIVACAINE HCL (PF) 0.25 % IJ SOLN
INTRAMUSCULAR | Status: AC
  Filled 2016-01-28: qty 180

## 2016-01-28 MED ORDER — LIDOCAINE HCL (CARDIAC) 20 MG/ML IV SOLN
INTRAVENOUS | Status: AC
  Filled 2016-01-28: qty 5

## 2016-01-28 MED ORDER — KETOROLAC TROMETHAMINE 30 MG/ML IJ SOLN
INTRAMUSCULAR | Status: DC | PRN
  Administered 2016-01-28: 30 mg via INTRAVENOUS

## 2016-01-28 MED ORDER — DEXAMETHASONE SODIUM PHOSPHATE 4 MG/ML IJ SOLN
INTRAMUSCULAR | Status: DC | PRN
  Administered 2016-01-28: 10 mg via INTRAVENOUS

## 2016-01-28 MED ORDER — FENTANYL CITRATE (PF) 100 MCG/2ML IJ SOLN
25.0000 ug | INTRAMUSCULAR | Status: DC | PRN
  Administered 2016-01-28: 50 ug via INTRAVENOUS

## 2016-01-28 MED ORDER — DEXAMETHASONE SODIUM PHOSPHATE 10 MG/ML IJ SOLN
INTRAMUSCULAR | Status: AC
  Filled 2016-01-28: qty 1

## 2016-01-28 MED ORDER — SUCCINYLCHOLINE CHLORIDE 20 MG/ML IJ SOLN
INTRAMUSCULAR | Status: AC
  Filled 2016-01-28: qty 1

## 2016-01-28 SURGICAL SUPPLY — 50 items
BLADE CLIPPER SURG (BLADE) IMPLANT
BLADE SURG 15 STRL LF DISP TIS (BLADE) ×2 IMPLANT
BLADE SURG 15 STRL SS (BLADE) ×4
CANISTER SUCT 1200ML W/VALVE (MISCELLANEOUS) IMPLANT
CHLORAPREP W/TINT 26ML (MISCELLANEOUS) IMPLANT
CLOSURE STERI-STRIP 1/2X4 (GAUZE/BANDAGES/DRESSINGS) ×1
CLSR STERI-STRIP ANTIMIC 1/2X4 (GAUZE/BANDAGES/DRESSINGS) ×2 IMPLANT
COVER BACK TABLE 60X90IN (DRAPES) ×3 IMPLANT
COVER MAYO STAND STRL (DRAPES) ×3 IMPLANT
DECANTER SPIKE VIAL GLASS SM (MISCELLANEOUS) IMPLANT
DRAPE LAPAROTOMY 100X72 PEDS (DRAPES) ×3 IMPLANT
DRSG TEGADERM 4X4.75 (GAUZE/BANDAGES/DRESSINGS) IMPLANT
ELECT COATED BLADE 2.86 ST (ELECTRODE) ×3 IMPLANT
ELECT REM PT RETURN 9FT ADLT (ELECTROSURGICAL)
ELECTRODE REM PT RTRN 9FT ADLT (ELECTROSURGICAL) IMPLANT
GAUZE PACKING IODOFORM 1/4X15 (GAUZE/BANDAGES/DRESSINGS) IMPLANT
GLOVE BIO SURGEON STRL SZ7 (GLOVE) IMPLANT
GLOVE BIOGEL PI IND STRL 6.5 (GLOVE) ×1 IMPLANT
GLOVE BIOGEL PI IND STRL 7.5 (GLOVE) ×1 IMPLANT
GLOVE BIOGEL PI INDICATOR 6.5 (GLOVE) ×2
GLOVE BIOGEL PI INDICATOR 7.5 (GLOVE) ×2
GLOVE ECLIPSE 6.5 STRL STRAW (GLOVE) ×3 IMPLANT
GLOVE EXAM NITRILE LRG STRL (GLOVE) ×6 IMPLANT
GOWN STRL REUS W/ TWL LRG LVL3 (GOWN DISPOSABLE) ×3 IMPLANT
GOWN STRL REUS W/TWL LRG LVL3 (GOWN DISPOSABLE) ×6
LIQUID BAND (GAUZE/BANDAGES/DRESSINGS) ×3 IMPLANT
MARKER SKIN DUAL TIP RULER LAB (MISCELLANEOUS) IMPLANT
NEEDLE HYPO 25X1 1.5 SAFETY (NEEDLE) ×3 IMPLANT
NS IRRIG 1000ML POUR BTL (IV SOLUTION) IMPLANT
PACK BASIN DAY SURGERY FS (CUSTOM PROCEDURE TRAY) ×3 IMPLANT
PACK LITHOTOMY IV (CUSTOM PROCEDURE TRAY) ×3 IMPLANT
PENCIL BUTTON HOLSTER BLD 10FT (ELECTRODE) ×3 IMPLANT
SLEEVE SCD COMPRESS KNEE MED (MISCELLANEOUS) ×3 IMPLANT
SPONGE GAUZE 4X4 12PLY STER LF (GAUZE/BANDAGES/DRESSINGS) IMPLANT
SUT ETHILON 2 0 FS 18 (SUTURE) IMPLANT
SUT MNCRL AB 4-0 PS2 18 (SUTURE) ×3 IMPLANT
SUT SILK 2 0 SH (SUTURE) IMPLANT
SUT VIC AB 2-0 SH 27 (SUTURE) ×2
SUT VIC AB 2-0 SH 27XBRD (SUTURE) ×1 IMPLANT
SUT VICRYL 3-0 CR8 SH (SUTURE) IMPLANT
SUT VICRYL 4-0 PS2 18IN ABS (SUTURE) IMPLANT
SWAB COLLECTION DEVICE MRSA (MISCELLANEOUS) IMPLANT
SWAB CULTURE ESWAB REG 1ML (MISCELLANEOUS) IMPLANT
SYR CONTROL 10ML LL (SYRINGE) ×3 IMPLANT
TOWEL OR 17X24 6PK STRL BLUE (TOWEL DISPOSABLE) ×3 IMPLANT
TOWEL OR NON WOVEN STRL DISP B (DISPOSABLE) ×3 IMPLANT
TUBE CONNECTING 20'X1/4 (TUBING)
TUBE CONNECTING 20X1/4 (TUBING) IMPLANT
UNDERPAD 30X30 (UNDERPADS AND DIAPERS) IMPLANT
YANKAUER SUCT BULB TIP NO VENT (SUCTIONS) IMPLANT

## 2016-01-28 NOTE — Anesthesia Postprocedure Evaluation (Signed)
Anesthesia Post Note  Patient: Toni Boone  Procedure(s) Performed: Procedure(s) (LRB): EXCISION OF THIGH MASS (Right)  Patient location during evaluation: PACU Anesthesia Type: General Level of consciousness: awake and alert Pain management: pain level controlled Vital Signs Assessment: post-procedure vital signs reviewed and stable Respiratory status: spontaneous breathing, nonlabored ventilation, respiratory function stable and patient connected to nasal cannula oxygen Cardiovascular status: blood pressure returned to baseline and stable Postop Assessment: no signs of nausea or vomiting Anesthetic complications: no    Last Vitals:  Filed Vitals:   01/28/16 0856 01/28/16 0922  BP: 121/78 110/64  Pulse: 60 53  Temp:  36.4 C  Resp: 15 16    Last Pain:  Filed Vitals:   01/28/16 0923  PainSc: 1                  Elyon Zoll J

## 2016-01-28 NOTE — Discharge Instructions (Signed)
[facility]   POST OP INSTRUCTIONS  Always review your discharge instruction sheet given to you by the facility where your surgery was performed. IF YOU HAVE DISABILITY OR FAMILY LEAVE FORMS, YOU MUST BRING THEM TO THE OFFICE FOR PROCESSING.   DO NOT GIVE THEM TO YOUR DOCTOR.  1. A  prescription for pain medication may be given to you upon discharge.  Take your pain medication as prescribed, if needed.  If narcotic pain medicine is not needed, then you may take acetaminophen (Tylenol), naprosyn (Alleve) or ibuprofen (Advil) as needed. 2. Take your usually prescribed medications unless otherwise directed. 3. If you need a refill on your pain medication, please contact your pharmacy.  They will contact our office to request authorization. Prescriptions will not be filled after 5 pm or on week-ends. 4. You should follow a light diet the first 24 hours after arrival home, such as soup and crackers, etc.  Be sure to include lots of fluids daily.  Resume your normal diet the day after surgery. 5. Most patients will experience some swelling and bruising around the incision.  Ice packs and reclining will help.  Swelling and bruising can take several days to resolve.  6. It is common to experience some constipation if taking pain medication after surgery.  Increasing fluid intake and taking a stool softener (such as Colace) will usually help or prevent this problem from occurring.  A mild laxative (Milk of Magnesia or Miralax) should be taken according to package directions if there are no bowel movements after 48 hours. 7. Uou may remove your bandages 48 hours after surgery, and you may shower at that time.  You may have steri-strips (small skin tapes) in place directly over the incision.  These strips should be left on the skin for 7-10 days and will come off on their own.   8. ACTIVITIES:  You may resume regular (light) daily activities beginning the next day--such as daily self-care,  walking, climbing stairs--gradually increasing activities as tolerated.  You may have sexual intercourse when it is comfortable.  Refrain from any heavy lifting or straining until approved by your doctor. a. You may drive when you are no longer taking prescription pain medication, you can comfortably wear a seatbelt, and you can safely maneuver your car and apply brakes. b. RETURN TO WORK:  __________________________________________________________ 9. You should see your doctor in the office for a follow-up appointment approximately 2-3 weeks after your surgery.  Make sure that you call for this appointment within a day or two after you arrive home to insure a convenient appointment time. 10. OTHER INSTRUCTIONS:  __________________________________________________________________________________________________________________________________________________________________________________________  WHEN TO CALL YOUR DOCTOR: 1. Fever over 101.0 2. Inability to urinate 3. Nausea and/or vomiting 4. Extreme swelling or bruising 5. Continued bleeding from incision. 6. Increased pain, redness, or drainage from the incision  The clinic staff is available to answer your questions during regular business hours.  Please dont hesitate to call and ask to speak to one of the nurses for clinical concerns.  If you have a medical emergency, go to the nearest emergency room or call 911.  A surgeon from [facility] is always on call at the hospital   [address] [place], [place], [place]  [address] ?  [address], [place], [place]   [address] [phone] ? [phone] ? FAX [phone] Web site: [email or web address]   Post Anesthesia Home Care Instructions  Activity: Get plenty of rest for the  remainder of the day. A responsible adult should stay with you for 24 hours following the procedure.  For the next 24 hours, DO NOT: -Drive a car -Paediatric nurse -Drink  alcoholic beverages -Take any medication unless instructed by your physician -Make any legal decisions or sign important papers.  Meals: Start with liquid foods such as gelatin or soup. Progress to regular foods as tolerated. Avoid greasy, spicy, heavy foods. If nausea and/or vomiting occur, drink only clear liquids until the nausea and/or vomiting subsides. Call your physician if vomiting continues.  Special Instructions/Symptoms: Your throat may feel dry or sore from the anesthesia or the breathing tube placed in your throat during surgery. If this causes discomfort, gargle with warm salt water. The discomfort should disappear within 24 hours.  If you had a scopolamine patch placed behind your ear for the management of post- operative nausea and/or vomiting:  1. The medication in the patch is effective for 72 hours, after which it should be removed.  Wrap patch in a tissue and discard in the trash. Wash hands thoroughly with soap and water. 2. You may remove the patch earlier than 72 hours if you experience unpleasant side effects which may include dry mouth, dizziness or visual disturbances. 3. Avoid touching the patch. Wash your hands with soap and water after contact with the patch.

## 2016-01-28 NOTE — Anesthesia Preprocedure Evaluation (Addendum)
Anesthesia Evaluation  Patient identified by MRN, date of birth, ID band Patient awake    Reviewed: Allergy & Precautions, NPO status , Patient's Chart, lab work & pertinent test results  Airway Mallampati: II  TM Distance: >3 FB Neck ROM: Full    Dental no notable dental hx.    Pulmonary neg pulmonary ROS,    Pulmonary exam normal breath sounds clear to auscultation       Cardiovascular Exercise Tolerance: Good negative cardio ROS Normal cardiovascular exam Rhythm:Regular Rate:Normal     Neuro/Psych  Headaches, Anxiety    GI/Hepatic Neg liver ROS, GERD  Medicated,  Endo/Other  Hypothyroidism   Renal/GU negative Renal ROS  negative genitourinary   Musculoskeletal negative musculoskeletal ROS (+)   Abdominal   Peds negative pediatric ROS (+)  Hematology negative hematology ROS (+)   Anesthesia Other Findings   Reproductive/Obstetrics negative OB ROS                             Anesthesia Physical Anesthesia Plan  ASA: II  Anesthesia Plan: General   Post-op Pain Management:    Induction: Intravenous  Airway Management Planned: LMA  Additional Equipment:   Intra-op Plan:   Post-operative Plan: Extubation in OR  Informed Consent: I have reviewed the patients History and Physical, chart, labs and discussed the procedure including the risks, benefits and alternatives for the proposed anesthesia with the patient or authorized representative who has indicated his/her understanding and acceptance.   Dental advisory given  Plan Discussed with: CRNA  Anesthesia Plan Comments:         Anesthesia Quick Evaluation

## 2016-01-28 NOTE — Anesthesia Procedure Notes (Signed)
Procedure Name: LMA Insertion Performed by: Deetta Siegmann W Pre-anesthesia Checklist: Patient identified, Emergency Drugs available, Suction available and Patient being monitored Patient Re-evaluated:Patient Re-evaluated prior to inductionOxygen Delivery Method: Circle System Utilized Preoxygenation: Pre-oxygenation with 100% oxygen Intubation Type: IV induction Ventilation: Mask ventilation without difficulty LMA: LMA inserted LMA Size: 4.0 Number of attempts: 1 Airway Equipment and Method: bite block Placement Confirmation: positive ETCO2 Tube secured with: Tape Dental Injury: Teeth and Oropharynx as per pre-operative assessment      

## 2016-01-28 NOTE — H&P (Signed)
  55 yof who is np presents with five year history of right inner thigh mass. she recently has hip and back pain and undergone eval. she noted this area was quite a bit larger. she has no prior infection.   Other Problems Elbert Ewings, CMA; 01/09/2016 9:44 AM) Anxiety Disorder Cancer Migraine Headache Thyroid Disease  Past Surgical History Elbert Ewings, CMA; 01/09/2016 9:44 AM) Cesarean Section - Multiple Gallbladder Surgery - Laparoscopic Tonsillectomy  Diagnostic Studies History Elbert Ewings, CMA; 01/09/2016 9:44 AM) Mammogram within last year Pap Smear 1-5 years ago  Allergies Elbert Ewings, CMA; 01/09/2016 9:45 AM) Avelox *Fluoroquinolones Penicillin V *PENICILLINS* Anaphylaxis. Codeine Sulfate *ANALGESICS - OPIOID* Macrodantin *Urinary Anti-infectives** Aspirin EC *ANALGESICS - NonNarcotic*  Medication History Elbert Ewings, CMA; 01/09/2016 9:45 AM) Xanax (0.5MG  Tablet, Oral) Active. Synthroid (50MCG Tablet, Oral) Active. Zantac (300MG  Tablet, Oral) Active. Multiple Vitamin (1 (one) Oral) Active. Tretinoin (Emollient) (0.05% Cream, External) Active. Medications Reconciled  Social History Elbert Ewings, [place]; 01/09/2016 9:44 AM) Alcohol use Occasional alcohol use. Caffeine use Coffee, Tea. No drug use Tobacco use Never smoker.  Family History Elbert Ewings, [place]; 01/09/2016 9:44 AM) Depression Mother. Heart Disease Father. Heart disease in female family member before age 11 Hypertension Father, Mother. Kidney Disease Mother. Migraine Headache Mother. Thyroid problems Father, Mother.  Pregnancy / Birth History Elbert Ewings, CMA; 01/09/2016 9:44 AM) Age at menarche 33 years. Age of menopause 77-55 Gravida 4 Maternal age 31-35 Para 2  Review of Systems Elbert Ewings CMA; 01/09/2016 9:44 AM) General Not Present- Appetite Loss, Chills, Fatigue, Fever, Night Sweats, Weight Gain and Weight Loss. Skin Present- New Lesions. Not Present- Change  in Wart/Mole, Dryness, Hives, Jaundice, Non-Healing Wounds, Rash and Ulcer. HEENT Not Present- Earache, Hearing Loss, Hoarseness, Nose Bleed, Oral Ulcers, Ringing in the Ears, Seasonal Allergies, Sinus Pain, Sore Throat, Visual Disturbances, Wears glasses/contact lenses and Yellow Eyes. Respiratory Not Present- Bloody sputum, Chronic Cough, Difficulty Breathing, Snoring and Wheezing. Breast Not Present- Breast Mass, Breast Pain, Nipple Discharge and Skin Changes. Cardiovascular Not Present- Chest Pain, Difficulty Breathing Lying Down, Leg Cramps, Palpitations, Rapid Heart Rate, Shortness of Breath and Swelling of Extremities. Gastrointestinal Not Present- Abdominal Pain, Bloating, Bloody Stool, Change in Bowel Habits, Chronic diarrhea, Constipation, Difficulty Swallowing, Excessive gas, Gets full quickly at meals, Hemorrhoids, Indigestion, Nausea, Rectal Pain and Vomiting. Female Genitourinary Not Present- Frequency, Nocturia, Painful Urination, Pelvic Pain and Urgency. Musculoskeletal Not Present- Back Pain, Joint Pain, Joint Stiffness, Muscle Pain, Muscle Weakness and Swelling of Extremities. Neurological Not Present- Decreased Memory, Fainting, Headaches, Numbness, Seizures, Tingling, Tremor, Trouble walking and Weakness. Psychiatric Present- Anxiety. Not Present- Bipolar, Change in Sleep Pattern, Depression, Fearful and Frequent crying. Endocrine Present- Hot flashes. Not Present- Cold Intolerance, Excessive Hunger, Hair Changes, Heat Intolerance and New Diabetes. Hematology Not Present- Easy Bruising, Excessive bleeding, Gland problems, HIV and Persistent Infections.  Vitals Elbert Ewings CMA; 01/09/2016 9:46 AM) 01/09/2016 9:45 AM Temp.: 97.47F  Pulse: 88 (Regular)  BP: 124/74 (Sitting, Left Arm, Standard) Physical Exam Rolm Bookbinder MD; 01/09/2016 10:29 AM) General Mental Status-Alert. Orientation-Oriented X3. Abdomen Note: 3x2 cm soft nontender mass right inner thigh, feels  like lipoma  Assessment & Plan Rolm Bookbinder MD; 01/09/2016 10:29 AM) MASS OF THIGH, RIGHT (R22.41) Story: I think this is benign but getting larger and is symptomatic. she would like excision and I think reasonable. will plan excisional biopsy under anesthesia. procedure and recovery discussed.

## 2016-01-28 NOTE — Interval H&P Note (Signed)
History and Physical Interval Note:  01/28/2016 7:16 AM  Toni Boone  has presented today for surgery, with the diagnosis of THIGH MASS  The various methods of treatment have been discussed with the patient and family. After consideration of risks, benefits and other options for treatment, the patient has consented to  Procedure(s): EXCISION OF THIGH MASS (Right) as a surgical intervention .  The patient's history has been reviewed, patient examined, no change in status, stable for surgery.  I have reviewed the patient's chart and labs.  Questions were answered to the patient's satisfaction.     Shante [place]et

## 2016-01-28 NOTE — Op Note (Signed)
Preoperative diagnosis: right inner thigh mass Postoperative diagnosis: same as above Procedure: excision of 2x3 cm right inner thigh mass Surgeon: Dr Serita Grammes Anesthesia: general EBL: minimal Drains none Specimen right inner thigh mass Complications: none Sponge count correct at completion Disposition to recovery stable  Indications: This is a 55 yo healthy female with an enlarging right inner thigh mass.  We discussed excision.   Procedure: After informed consent was obtained the patient was taken to the operating room.  Sequential compression devices were on her legs. She was placed under general anesthesia without complication. She was then placed in lithotomy and appropriately padded. She was prepped and draped in the standard sterile surgical fashion.  A surgical timeout was performed.     I infiltrated marcaine around the mass in the inner thigh. I made a curvilinear incision overlying what appeared to be a lipoma. This was removed in its entirety.  This was adherent to the skin in one part and I did excised some skin overlying it also.  I then obtained hemostasis.  This was closed with 2-0 vicryl, 3-0 vicryl and 4-0 monocryl.  Benzoin and steristrips were applied.  She tolerated this well was extubated and transferred to the recovery room in stable condition

## 2016-01-28 NOTE — Transfer of Care (Signed)
Immediate Anesthesia Transfer of Care Note  Patient: Toni Boone  Procedure(s) Performed: Procedure(s): EXCISION OF THIGH MASS (Right)  Patient Location: PACU  Anesthesia Type:General  Level of Consciousness: awake and sedated  Airway & Oxygen Therapy: Patient Spontanous Breathing and Patient connected to face mask oxygen  Post-op Assessment: Report given to RN and Post -op Vital signs reviewed and stable  Post vital signs: Reviewed and stable  Last Vitals:  Filed Vitals:   01/28/16 0631  BP: 122/65  Pulse: 74  Temp: 36.7 C  Resp: 20    Complications: No apparent anesthesia complications

## 2016-01-29 ENCOUNTER — Encounter (HOSPITAL_BASED_OUTPATIENT_CLINIC_OR_DEPARTMENT_OTHER): Payer: Self-pay | Admitting: General Surgery

## 2016-02-05 ENCOUNTER — Other Ambulatory Visit: Payer: Self-pay | Admitting: Family Medicine

## 2016-02-05 DIAGNOSIS — L578 Other skin changes due to chronic exposure to nonionizing radiation: Secondary | ICD-10-CM | POA: Diagnosis not present

## 2016-02-05 DIAGNOSIS — L718 Other rosacea: Secondary | ICD-10-CM | POA: Diagnosis not present

## 2016-02-05 MED FILL — LEVOTHYROXINE 50 MCG TABLET: 50 | 90 days supply | Qty: 90 | Fill #0

## 2016-02-05 MED FILL — FLUOROURACIL 5% CREAM: 5 | 30 days supply | Qty: 40 | Fill #0

## 2016-04-10 DIAGNOSIS — L570 Actinic keratosis: Secondary | ICD-10-CM | POA: Diagnosis not present

## 2016-04-10 DIAGNOSIS — L820 Inflamed seborrheic keratosis: Secondary | ICD-10-CM | POA: Diagnosis not present

## 2016-05-06 ENCOUNTER — Encounter: Payer: Self-pay | Admitting: Family Medicine

## 2016-05-07 ENCOUNTER — Other Ambulatory Visit: Payer: Self-pay | Admitting: Family Medicine

## 2016-05-07 MED ORDER — ESCITALOPRAM OXALATE 10 MG PO TABS: 10.0000 mg | ORAL_TABLET | Freq: Every day | ORAL | Status: DC

## 2016-05-07 MED FILL — ESCITALOPRAM 10 MG TABLET: 10 | 30 days supply | Qty: 30 | Fill #0

## 2016-05-16 MED FILL — LEVOTHYROXINE 50 MCG TABLET: 50 | 90 days supply | Qty: 90 | Fill #1

## 2016-07-04 MED FILL — ESCITALOPRAM 10 MG TABLET: 10 | 30 days supply | Qty: 30 | Fill #1

## 2016-07-11 MED FILL — ESCITALOPRAM 10 MG TABLET: 10 | 30 days supply | Qty: 30 | Fill #2

## 2016-08-25 ENCOUNTER — Other Ambulatory Visit: Payer: Self-pay | Admitting: Family Medicine

## 2016-08-25 MED FILL — LEVOTHYROXINE 50 MCG TABLET: 50 | 90 days supply | Qty: 90 | Fill #0

## 2016-09-18 DIAGNOSIS — L570 Actinic keratosis: Secondary | ICD-10-CM | POA: Diagnosis not present

## 2016-09-18 DIAGNOSIS — L739 Follicular disorder, unspecified: Secondary | ICD-10-CM | POA: Diagnosis not present

## 2016-09-18 MED FILL — DICLOFENAC SODIUM 3% GEL: 3 | 20 days supply | Qty: 100 | Fill #0

## 2016-10-16 DIAGNOSIS — H5213 Myopia, bilateral: Secondary | ICD-10-CM | POA: Diagnosis not present

## 2016-10-16 DIAGNOSIS — H524 Presbyopia: Secondary | ICD-10-CM | POA: Diagnosis not present

## 2016-10-16 DIAGNOSIS — H52223 Regular astigmatism, bilateral: Secondary | ICD-10-CM | POA: Diagnosis not present

## 2016-10-16 DIAGNOSIS — H43813 Vitreous degeneration, bilateral: Secondary | ICD-10-CM | POA: Diagnosis not present

## 2016-10-16 DIAGNOSIS — H04123 Dry eye syndrome of bilateral lacrimal glands: Secondary | ICD-10-CM | POA: Diagnosis not present

## 2016-10-23 ENCOUNTER — Ambulatory Visit (INDEPENDENT_AMBULATORY_CARE_PROVIDER_SITE_OTHER): Payer: 59 | Admitting: Family Medicine

## 2016-10-23 ENCOUNTER — Encounter: Payer: Self-pay | Admitting: Family Medicine

## 2016-10-23 VITALS — BP 124/69 | HR 63 | Temp 98.3°F | Ht 65.0 in | Wt 153.5 lb

## 2016-10-23 DIAGNOSIS — Z0000 Encounter for general adult medical examination without abnormal findings: Secondary | ICD-10-CM

## 2016-10-23 DIAGNOSIS — E559 Vitamin D deficiency, unspecified: Secondary | ICD-10-CM

## 2016-10-23 DIAGNOSIS — F32A Depression, unspecified: Secondary | ICD-10-CM

## 2016-10-23 DIAGNOSIS — E782 Mixed hyperlipidemia: Secondary | ICD-10-CM

## 2016-10-23 DIAGNOSIS — F329 Major depressive disorder, single episode, unspecified: Secondary | ICD-10-CM

## 2016-10-23 DIAGNOSIS — E039 Hypothyroidism, unspecified: Secondary | ICD-10-CM

## 2016-10-23 DIAGNOSIS — Z23 Encounter for immunization: Secondary | ICD-10-CM | POA: Diagnosis not present

## 2016-10-23 HISTORY — DX: Depression, unspecified: F32.A

## 2016-10-23 LAB — LIPID PANEL
CHOLESTEROL: 228 mg/dL — AB (ref 0–200)
HDL: 55.1 mg/dL (ref >39.00)
LDL CALC: 151 mg/dL — AB (ref 0–99)
NonHDL: 173.37
Total CHOL/HDL Ratio: 4
Triglycerides: 112 mg/dL (ref 0.0–149.0)
VLDL: 22.4 mg/dL (ref 0.0–40.0)

## 2016-10-23 LAB — COMPREHENSIVE METABOLIC PANEL
ALBUMIN: 4.6 g/dL (ref 3.5–5.2)
ALT: 38 U/L — ABNORMAL HIGH (ref 0–35)
AST: 29 U/L (ref 0–37)
Alkaline Phosphatase: 58 U/L (ref 39–117)
BUN: 13 mg/dL (ref 6–23)
CALCIUM: 10.1 mg/dL (ref 8.4–10.5)
CHLORIDE: 103 meq/L (ref 96–112)
CO2: 30 mEq/L (ref 19–32)
CREATININE: 0.82 mg/dL (ref 0.40–1.20)
GFR: 76.84 mL/min (ref >60.00)
Glucose, Bld: 85 mg/dL (ref 70–99)
Potassium: 4.7 mEq/L (ref 3.5–5.1)
Sodium: 140 mEq/L (ref 135–145)
Total Bilirubin: 0.5 mg/dL (ref 0.2–1.2)
Total Protein: 8 g/dL (ref 6.0–8.3)

## 2016-10-23 LAB — CBC
HCT: 40.5 % (ref 36.0–46.0)
HEMOGLOBIN: 13.7 g/dL (ref 12.0–15.0)
MCHC: 33.7 g/dL (ref 30.0–36.0)
MCV: 92.2 fl (ref 78.0–100.0)
PLATELETS: 251 10*3/uL (ref 150.0–400.0)
RBC: 4.4 Mil/uL (ref 3.87–5.11)
RDW: 13 % (ref 11.5–15.5)
WBC: 6.8 10*3/uL (ref 4.0–10.5)

## 2016-10-23 LAB — TSH: TSH: 3.6 u[IU]/mL (ref 0.35–4.50)

## 2016-10-23 NOTE — Assessment & Plan Note (Signed)
Doing well on Lexapro

## 2016-10-23 NOTE — Assessment & Plan Note (Signed)
On Levothyroxine, continue to monitor 

## 2016-10-23 NOTE — Assessment & Plan Note (Signed)
Encouraged heart healthy diet, increase exercise, avoid trans fats, consider a krill oil cap daily 

## 2016-10-23 NOTE — Patient Instructions (Signed)
Preventive Care for Adults, Female A healthy lifestyle and preventive care can promote health and wellness. Preventive health guidelines for women include the following key practices.  A routine yearly physical is a good way to check with your health care provider about your health and preventive screening. It is a chance to share any concerns and updates on your health and to receive a thorough exam.  Visit your dentist for a routine exam and preventive care every 6 months. Brush your teeth twice a day and floss once a day. Good oral hygiene prevents tooth decay and gum disease.  The frequency of eye exams is based on your age, health, family medical history, use of contact lenses, and other factors. Follow your health care provider's recommendations for frequency of eye exams.  Eat a healthy diet. Foods like vegetables, fruits, whole grains, low-fat dairy products, and lean protein foods contain the nutrients you need without too many calories. Decrease your intake of foods high in solid fats, added sugars, and salt. Eat the right amount of calories for you.Get information about a proper diet from your health care provider, if necessary.  Regular physical exercise is one of the most important things you can do for your health. Most adults should get at least 150 minutes of moderate-intensity exercise (any activity that increases your heart rate and causes you to sweat) each week. In addition, most adults need muscle-strengthening exercises on 2 or more days a week.  Maintain a healthy weight. The body mass index (BMI) is a screening tool to identify possible weight problems. It provides an estimate of body fat based on height and weight. Your health care provider can find your BMI and can help you achieve or maintain a healthy weight.For adults 20 years and older:  A BMI below 18.5 is considered underweight.  A BMI of 18.5 to 24.9 is normal.  A BMI of 25 to 29.9 is considered overweight.  A  BMI of 30 and above is considered obese.  Maintain normal blood lipids and cholesterol levels by exercising and minimizing your intake of saturated fat. Eat a balanced diet with plenty of fruit and vegetables. Blood tests for lipids and cholesterol should begin at age 45 and be repeated every 5 years. If your lipid or cholesterol levels are high, you are over 50, or you are at high risk for heart disease, you may need your cholesterol levels checked more frequently.Ongoing high lipid and cholesterol levels should be treated with medicines if diet and exercise are not working.  If you smoke, find out from your health care provider how to quit. If you do not use tobacco, do not start.  Lung cancer screening is recommended for adults aged 45-80 years who are at high risk for developing lung cancer because of a history of smoking. A yearly low-dose CT scan of the lungs is recommended for people who have at least a 30-pack-year history of smoking and are a current smoker or have quit within the past 15 years. A pack year of smoking is smoking an average of 1 pack of cigarettes a day for 1 year (for example: 1 pack a day for 30 years or 2 packs a day for 15 years). Yearly screening should continue until the smoker has stopped smoking for at least 15 years. Yearly screening should be stopped for people who develop a health problem that would prevent them from having lung cancer treatment.  If you are pregnant, do not drink alcohol. If you are  breastfeeding, be very cautious about drinking alcohol. If you are not pregnant and choose to drink alcohol, do not have more than 1 drink per day. One drink is considered to be 12 ounces (355 mL) of beer, 5 ounces (148 mL) of wine, or 1.5 ounces (44 mL) of liquor.  Avoid use of street drugs. Do not share needles with anyone. Ask for help if you need support or instructions about stopping the use of drugs.  High blood pressure causes heart disease and increases the risk  of stroke. Your blood pressure should be checked at least every 1 to 2 years. Ongoing high blood pressure should be treated with medicines if weight loss and exercise do not work.  If you are 55-79 years old, ask your health care provider if you should take aspirin to prevent strokes.  Diabetes screening is done by taking a blood sample to check your blood glucose level after you have not eaten for a certain period of time (fasting). If you are not overweight and you do not have risk factors for diabetes, you should be screened once every 3 years starting at age 45. If you are overweight or obese and you are 40-70 years of age, you should be screened for diabetes every year as part of your cardiovascular risk assessment.  Breast cancer screening is essential preventive care for women. You should practice "breast self-awareness." This means understanding the normal appearance and feel of your breasts and may include breast self-examination. Any changes detected, no matter how small, should be reported to a health care provider. Women in their 20s and 30s should have a clinical breast exam (CBE) by a health care provider as part of a regular health exam every 1 to 3 years. After age 40, women should have a CBE every year. Starting at age 40, women should consider having a mammogram (breast X-ray test) every year. Women who have a family history of breast cancer should talk to their health care provider about genetic screening. Women at a high risk of breast cancer should talk to their health care providers about having an MRI and a mammogram every year.  Breast cancer gene (BRCA)-related cancer risk assessment is recommended for women who have family members with BRCA-related cancers. BRCA-related cancers include breast, ovarian, tubal, and peritoneal cancers. Having family members with these cancers may be associated with an increased risk for harmful changes (mutations) in the breast cancer genes BRCA1 and  BRCA2. Results of the assessment will determine the need for genetic counseling and BRCA1 and BRCA2 testing.  Your health care provider may recommend that you be screened regularly for cancer of the pelvic organs (ovaries, uterus, and vagina). This screening involves a pelvic examination, including checking for microscopic changes to the surface of your cervix (Pap test). You may be encouraged to have this screening done every 3 years, beginning at age 21.  For women ages 30-65, health care providers may recommend pelvic exams and Pap testing every 3 years, or they may recommend the Pap and pelvic exam, combined with testing for human papilloma virus (HPV), every 5 years. Some types of HPV increase your risk of cervical cancer. Testing for HPV may also be done on women of any age with unclear Pap test results.  Other health care providers may not recommend any screening for nonpregnant women who are considered low risk for pelvic cancer and who do not have symptoms. Ask your health care provider if a screening pelvic exam is right for   you.  If you have had past treatment for cervical cancer or a condition that could lead to cancer, you need Pap tests and screening for cancer for at least 20 years after your treatment. If Pap tests have been discontinued, your risk factors (such as having a new sexual partner) need to be reassessed to determine if screening should resume. Some women have medical problems that increase the chance of getting cervical cancer. In these cases, your health care provider may recommend more frequent screening and Pap tests.  Colorectal cancer can be detected and often prevented. Most routine colorectal cancer screening begins at the age of 50 years and continues through age 75 years. However, your health care provider may recommend screening at an earlier age if you have risk factors for colon cancer. On a yearly basis, your health care provider may provide home test kits to check  for hidden blood in the stool. Use of a small camera at the end of a tube, to directly examine the colon (sigmoidoscopy or colonoscopy), can detect the earliest forms of colorectal cancer. Talk to your health care provider about this at age 50, when routine screening begins. Direct exam of the colon should be repeated every 5-10 years through age 75 years, unless early forms of precancerous polyps or small growths are found.  People who are at an increased risk for hepatitis B should be screened for this virus. You are considered at high risk for hepatitis B if:  You were born in a country where hepatitis B occurs often. Talk with your health care provider about which countries are considered high risk.  Your parents were born in a high-risk country and you have not received a shot to protect against hepatitis B (hepatitis B vaccine).  You have HIV or AIDS.  You use needles to inject street drugs.  You live with, or have sex with, someone who has hepatitis B.  You get hemodialysis treatment.  You take certain medicines for conditions like cancer, organ transplantation, and autoimmune conditions.  Hepatitis C blood testing is recommended for all people born from 1945 through 1965 and any individual with known risks for hepatitis C.  Practice safe sex. Use condoms and avoid high-risk sexual practices to reduce the spread of sexually transmitted infections (STIs). STIs include gonorrhea, chlamydia, syphilis, trichomonas, herpes, HPV, and human immunodeficiency virus (HIV). Herpes, HIV, and HPV are viral illnesses that have no cure. They can result in disability, cancer, and death.  You should be screened for sexually transmitted illnesses (STIs) including gonorrhea and chlamydia if:  You are sexually active and are younger than 24 years.  You are older than 24 years and your health care provider tells you that you are at risk for this type of infection.  Your sexual activity has changed  since you were last screened and you are at an increased risk for chlamydia or gonorrhea. Ask your health care provider if you are at risk.  If you are at risk of being infected with HIV, it is recommended that you take a prescription medicine daily to prevent HIV infection. This is called preexposure prophylaxis (PrEP). You are considered at risk if:  You are sexually active and do not regularly use condoms or know the HIV status of your partner(s).  You take drugs by injection.  You are sexually active with a partner who has HIV.  Talk with your health care provider about whether you are at high risk of being infected with HIV. If   you choose to begin PrEP, you should first be tested for HIV. You should then be tested every 3 months for as long as you are taking PrEP.  Osteoporosis is a disease in which the bones lose minerals and strength with aging. This can result in serious bone fractures or breaks. The risk of osteoporosis can be identified using a bone density scan. Women ages 67 years and over and women at risk for fractures or osteoporosis should discuss screening with their health care providers. Ask your health care provider whether you should take a calcium supplement or vitamin D to reduce the rate of osteoporosis.  Menopause can be associated with physical symptoms and risks. Hormone replacement therapy is available to decrease symptoms and risks. You should talk to your health care provider about whether hormone replacement therapy is right for you.  Use sunscreen. Apply sunscreen liberally and repeatedly throughout the day. You should seek shade when your shadow is shorter than you. Protect yourself by wearing long sleeves, pants, a wide-brimmed hat, and sunglasses year round, whenever you are outdoors.  Once a month, do a whole body skin exam, using a mirror to look at the skin on your back. Tell your health care provider of new moles, moles that have irregular borders, moles that  are larger than a pencil eraser, or moles that have changed in shape or color.  Stay current with required vaccines (immunizations).  Influenza vaccine. All adults should be immunized every year.  Tetanus, diphtheria, and acellular pertussis (Td, Tdap) vaccine. Pregnant women should receive 1 dose of Tdap vaccine during each pregnancy. The dose should be obtained regardless of the length of time since the last dose. Immunization is preferred during the 27th-36th week of gestation. An adult who has not previously received Tdap or who does not know her vaccine status should receive 1 dose of Tdap. This initial dose should be followed by tetanus and diphtheria toxoids (Td) booster doses every 10 years. Adults with an unknown or incomplete history of completing a 3-dose immunization series with Td-containing vaccines should begin or complete a primary immunization series including a Tdap dose. Adults should receive a Td booster every 10 years.  Varicella vaccine. An adult without evidence of immunity to varicella should receive 2 doses or a second dose if she has previously received 1 dose. Pregnant females who do not have evidence of immunity should receive the first dose after pregnancy. This first dose should be obtained before leaving the health care facility. The second dose should be obtained 4-8 weeks after the first dose.  Human papillomavirus (HPV) vaccine. Females aged 13-26 years who have not received the vaccine previously should obtain the 3-dose series. The vaccine is not recommended for use in pregnant females. However, pregnancy testing is not needed before receiving a dose. If a female is found to be pregnant after receiving a dose, no treatment is needed. In that case, the remaining doses should be delayed until after the pregnancy. Immunization is recommended for any person with an immunocompromised condition through the age of 61 years if she did not get any or all doses earlier. During the  3-dose series, the second dose should be obtained 4-8 weeks after the first dose. The third dose should be obtained 24 weeks after the first dose and 16 weeks after the second dose.  Zoster vaccine. One dose is recommended for adults aged 30 years or older unless certain conditions are present.  Measles, mumps, and rubella (MMR) vaccine. Adults born  before 1957 generally are considered immune to measles and mumps. Adults born in 1957 or later should have 1 or more doses of MMR vaccine unless there is a contraindication to the vaccine or there is laboratory evidence of immunity to each of the three diseases. A routine second dose of MMR vaccine should be obtained at least 28 days after the first dose for students attending postsecondary schools, health care workers, or international travelers. People who received inactivated measles vaccine or an unknown type of measles vaccine during 1963-1967 should receive 2 doses of MMR vaccine. People who received inactivated mumps vaccine or an unknown type of mumps vaccine before 1979 and are at high risk for mumps infection should consider immunization with 2 doses of MMR vaccine. For females of childbearing age, rubella immunity should be determined. If there is no evidence of immunity, females who are not pregnant should be vaccinated. If there is no evidence of immunity, females who are pregnant should delay immunization until after pregnancy. Unvaccinated health care workers born before 1957 who lack laboratory evidence of measles, mumps, or rubella immunity or laboratory confirmation of disease should consider measles and mumps immunization with 2 doses of MMR vaccine or rubella immunization with 1 dose of MMR vaccine.  Pneumococcal 13-valent conjugate (PCV13) vaccine. When indicated, a person who is uncertain of his immunization history and has no record of immunization should receive the PCV13 vaccine. All adults 65 years of age and older should receive this  vaccine. An adult aged 19 years or older who has certain medical conditions and has not been previously immunized should receive 1 dose of PCV13 vaccine. This PCV13 should be followed with a dose of pneumococcal polysaccharide (PPSV23) vaccine. Adults who are at high risk for pneumococcal disease should obtain the PPSV23 vaccine at least 8 weeks after the dose of PCV13 vaccine. Adults older than 55 years of age who have normal immune system function should obtain the PPSV23 vaccine dose at least 1 year after the dose of PCV13 vaccine.  Pneumococcal polysaccharide (PPSV23) vaccine. When PCV13 is also indicated, PCV13 should be obtained first. All adults aged 65 years and older should be immunized. An adult younger than age 65 years who has certain medical conditions should be immunized. Any person who resides in a nursing home or long-term care facility should be immunized. An adult smoker should be immunized. People with an immunocompromised condition and certain other conditions should receive both PCV13 and PPSV23 vaccines. People with human immunodeficiency virus (HIV) infection should be immunized as soon as possible after diagnosis. Immunization during chemotherapy or radiation therapy should be avoided. Routine use of PPSV23 vaccine is not recommended for American Indians, [place] Natives, or people younger than 65 years unless there are medical conditions that require PPSV23 vaccine. When indicated, people who have unknown immunization and have no record of immunization should receive PPSV23 vaccine. One-time revaccination 5 years after the first dose of PPSV23 is recommended for people aged 19-64 years who have chronic kidney failure, nephrotic syndrome, asplenia, or immunocompromised conditions. People who received 1-2 doses of PPSV23 before age 65 years should receive another dose of PPSV23 vaccine at age 65 years or later if at least 5 years have passed since the previous dose. Doses of PPSV23 are not  needed for people immunized with PPSV23 at or after age 65 years.  Meningococcal vaccine. Adults with asplenia or persistent complement component deficiencies should receive 2 doses of quadrivalent meningococcal conjugate (MenACWY-D) vaccine. The doses should be obtained   at least 2 months apart. Microbiologists working with certain meningococcal bacteria, [place] recruits, people at risk during an outbreak, and people who travel to or live in countries with a high rate of meningitis should be immunized. A first-year college student up through age 34 years who is living in a residence hall should receive a dose if she did not receive a dose on or after her 16th birthday. Adults who have certain high-risk conditions should receive one or more doses of vaccine.  Hepatitis A vaccine. Adults who wish to be protected from this disease, have certain high-risk conditions, work with hepatitis A-infected animals, work in hepatitis A research labs, or travel to or work in countries with a high rate of hepatitis A should be immunized. Adults who were previously unvaccinated and who anticipate close contact with an international adoptee during the first 60 days after arrival in the [place] from a country with a high rate of hepatitis A should be immunized.  Hepatitis B vaccine. Adults who wish to be protected from this disease, have certain high-risk conditions, may be exposed to blood or other infectious body fluids, are household contacts or sex partners of hepatitis B positive people, are clients or workers in certain care facilities, or travel to or work in countries with a high rate of hepatitis B should be immunized.  Haemophilus influenzae type b (Hib) vaccine. A previously unvaccinated person with asplenia or sickle cell disease or having a scheduled splenectomy should receive 1 dose of Hib vaccine. Regardless of previous immunization, a recipient of a hematopoietic stem cell transplant should receive a  3-dose series 6-12 months after her successful transplant. Hib vaccine is not recommended for adults with HIV infection. Preventive Services / Frequency Ages 35 to 4 years  Blood pressure check.** / Every 3-5 years.  Lipid and cholesterol check.** / Every 5 years beginning at age 60.  Clinical breast exam.** / Every 3 years for women in their 71s and 10s.  BRCA-related cancer risk assessment.** / For women who have family members with a BRCA-related cancer (breast, ovarian, tubal, or peritoneal cancers).  Pap test.** / Every 2 years from ages 76 through 26. Every 3 years starting at age 61 through age 76 or 93 with a history of 3 consecutive normal Pap tests.  HPV screening.** / Every 3 years from ages 37 through ages 60 to 51 with a history of 3 consecutive normal Pap tests.  Hepatitis C blood test.** / For any individual with known risks for hepatitis C.  Skin self-exam. / Monthly.  Influenza vaccine. / Every year.  Tetanus, diphtheria, and acellular pertussis (Tdap, Td) vaccine.** / Consult your health care provider. Pregnant women should receive 1 dose of Tdap vaccine during each pregnancy. 1 dose of Td every 10 years.  Varicella vaccine.** / Consult your health care provider. Pregnant females who do not have evidence of immunity should receive the first dose after pregnancy.  HPV vaccine. / 3 doses over 6 months, if 93 and younger. The vaccine is not recommended for use in pregnant females. However, pregnancy testing is not needed before receiving a dose.  Measles, mumps, rubella (MMR) vaccine.** / You need at least 1 dose of MMR if you were born in 1957 or later. You may also need a 2nd dose. For females of childbearing age, rubella immunity should be determined. If there is no evidence of immunity, females who are not pregnant should be vaccinated. If there is no evidence of immunity, females who are  pregnant should delay immunization until after pregnancy.  Pneumococcal  13-valent conjugate (PCV13) vaccine.** / Consult your health care provider.  Pneumococcal polysaccharide (PPSV23) vaccine.** / 1 to 2 doses if you smoke cigarettes or if you have certain conditions.  Meningococcal vaccine.** / 1 dose if you are age 68 to 8 years and a Market researcher living in a residence hall, or have one of several medical conditions, you need to get vaccinated against meningococcal disease. You may also need additional booster doses.  Hepatitis A vaccine.** / Consult your health care provider.  Hepatitis B vaccine.** / Consult your health care provider.  Haemophilus influenzae type b (Hib) vaccine.** / Consult your health care provider. Ages 7 to 53 years  Blood pressure check.** / Every year.  Lipid and cholesterol check.** / Every 5 years beginning at age 25 years.  Lung cancer screening. / Every year if you are aged 11-80 years and have a 30-pack-year history of smoking and currently smoke or have quit within the past 15 years. Yearly screening is stopped once you have quit smoking for at least 15 years or develop a health problem that would prevent you from having lung cancer treatment.  Clinical breast exam.** / Every year after age 48 years.  BRCA-related cancer risk assessment.** / For women who have family members with a BRCA-related cancer (breast, ovarian, tubal, or peritoneal cancers).  Mammogram.** / Every year beginning at age 41 years and continuing for as long as you are in good health. Consult with your health care provider.  Pap test.** / Every 3 years starting at age 65 years through age 37 or 70 years with a history of 3 consecutive normal Pap tests.  HPV screening.** / Every 3 years from ages 72 years through ages 60 to 40 years with a history of 3 consecutive normal Pap tests.  Fecal occult blood test (FOBT) of stool. / Every year beginning at age 21 years and continuing until age 5 years. You may not need to do this test if you get  a colonoscopy every 10 years.  Flexible sigmoidoscopy or colonoscopy.** / Every 5 years for a flexible sigmoidoscopy or every 10 years for a colonoscopy beginning at age 35 years and continuing until age 48 years.  Hepatitis C blood test.** / For all people born from [birth year] through 1965 and any individual with known risks for hepatitis C.  Skin self-exam. / Monthly.  Influenza vaccine. / Every year.  Tetanus, diphtheria, and acellular pertussis (Tdap/Td) vaccine.** / Consult your health care provider. Pregnant women should receive 1 dose of Tdap vaccine during each pregnancy. 1 dose of Td every 10 years.  Varicella vaccine.** / Consult your health care provider. Pregnant females who do not have evidence of immunity should receive the first dose after pregnancy.  Zoster vaccine.** / 1 dose for adults aged 30 years or older.  Measles, mumps, rubella (MMR) vaccine.** / You need at least 1 dose of MMR if you were born in 1957 or later. You may also need a second dose. For females of childbearing age, rubella immunity should be determined. If there is no evidence of immunity, females who are not pregnant should be vaccinated. If there is no evidence of immunity, females who are pregnant should delay immunization until after pregnancy.  Pneumococcal 13-valent conjugate (PCV13) vaccine.** / Consult your health care provider.  Pneumococcal polysaccharide (PPSV23) vaccine.** / 1 to 2 doses if you smoke cigarettes or if you have certain conditions.  Meningococcal vaccine.** /  Consult your health care provider.  Hepatitis A vaccine.** / Consult your health care provider.  Hepatitis B vaccine.** / Consult your health care provider.  Haemophilus influenzae type b (Hib) vaccine.** / Consult your health care provider. Ages 64 years and over  Blood pressure check.** / Every year.  Lipid and cholesterol check.** / Every 5 years beginning at age 23 years.  Lung cancer screening. / Every year if you  are aged 16-80 years and have a 30-pack-year history of smoking and currently smoke or have quit within the past 15 years. Yearly screening is stopped once you have quit smoking for at least 15 years or develop a health problem that would prevent you from having lung cancer treatment.  Clinical breast exam.** / Every year after age 74 years.  BRCA-related cancer risk assessment.** / For women who have family members with a BRCA-related cancer (breast, ovarian, tubal, or peritoneal cancers).  Mammogram.** / Every year beginning at age 44 years and continuing for as long as you are in good health. Consult with your health care provider.  Pap test.** / Every 3 years starting at age 58 years through age 22 or 39 years with 3 consecutive normal Pap tests. Testing can be stopped between 65 and 70 years with 3 consecutive normal Pap tests and no abnormal Pap or HPV tests in the past 10 years.  HPV screening.** / Every 3 years from ages 64 years through ages 70 or 61 years with a history of 3 consecutive normal Pap tests. Testing can be stopped between 65 and 70 years with 3 consecutive normal Pap tests and no abnormal Pap or HPV tests in the past 10 years.  Fecal occult blood test (FOBT) of stool. / Every year beginning at age 40 years and continuing until age 27 years. You may not need to do this test if you get a colonoscopy every 10 years.  Flexible sigmoidoscopy or colonoscopy.** / Every 5 years for a flexible sigmoidoscopy or every 10 years for a colonoscopy beginning at age 7 years and continuing until age 32 years.  Hepatitis C blood test.** / For all people born from [birth year] through 1965 and any individual with known risks for hepatitis C.  Osteoporosis screening.** / A one-time screening for women ages 30 years and over and women at risk for fractures or osteoporosis.  Skin self-exam. / Monthly.  Influenza vaccine. / Every year.  Tetanus, diphtheria, and acellular pertussis (Tdap/Td)  vaccine.** / 1 dose of Td every 10 years.  Varicella vaccine.** / Consult your health care provider.  Zoster vaccine.** / 1 dose for adults aged 35 years or older.  Pneumococcal 13-valent conjugate (PCV13) vaccine.** / Consult your health care provider.  Pneumococcal polysaccharide (PPSV23) vaccine.** / 1 dose for all adults aged 46 years and older.  Meningococcal vaccine.** / Consult your health care provider.  Hepatitis A vaccine.** / Consult your health care provider.  Hepatitis B vaccine.** / Consult your health care provider.  Haemophilus influenzae type b (Hib) vaccine.** / Consult your health care provider. ** Family history and personal history of risk and conditions may change your health care provider's recommendations.   This information is not intended to replace advice given to you by your health care provider. Make sure you discuss any questions you have with your health care provider.   Document Released: 01/27/2002 Document Revised: 12/22/2014 Document Reviewed: 04/28/2011 Elsevier Interactive Patient Education [employer].

## 2016-10-23 NOTE — Progress Notes (Signed)
Patient ID: Toni Boone, female   DOB: 06/21/1961, 55 y.o.   MRN: [id number]   Subjective:    Patient ID: Toni Boone, female    DOB: 04/07/1961, 55 y.o.   MRN: [id number]  Chief Complaint  Patient presents with  . Annual Exam    HPI Patient is in today for annual preventative exam. She is doing well although she did loose her father in August and her husband lost his job last year. No recent illness or concerns. No hospitalizations. Anxiety and depression are controlled on Lexapro. Denies CP/palp/SOB/HA/congestion/fevers/GI or GU c/o. Taking meds as prescribed  Past Medical History:  Diagnosis Date  . Abdominal pain, epigastric 04/15/2015  . Actinic keratosis   . Anxiety   . Cancer ([place]) 2012   basal cell- left shoulder  . Cervical cancer screening 04/24/2013   Has a history of a cervical polyp found on evaluation of some cellular changes, D&C revealed only the polyp LMP 3/14  . Chicken pox as a child  . Depression 10/23/2016  . Fibrocystic breast   . GERD (gastroesophageal reflux disease)   . IBS (irritable bowel syndrome) 04/20/2013  . Light headedness 04/15/2015  . Other and unspecified hyperlipidemia 06/02/2013  . Preventative health care 06/02/2013  . Right hip pain 10/28/2015  . Unspecified hypothyroidism 08/26/2014  . Vitamin D deficiency 2013  . Vitamin D deficiency     Past Surgical History:  Procedure Laterality Date  . AXILLARY LYMPH NODE BIOPSY  80's   left  . basal cell removal  2012   left shoulder  . BREAST SURGERY  1996 and 2012   left breast- benign  . CESAREAN SECTION  94 and 95  . CHOLECYSTECTOMY  11-12  . [place]  . MASS EXCISION Right 01/28/2016   Procedure: EXCISION OF THIGH MASS;  Surgeon: Rolm Bookbinder, MD;  Location: [facility];  Service: General;  Laterality: Right;  . TONSILLECTOMY  as a child  . TONSILLECTOMY      Family History  Problem Relation Age of Onset  . Hypertension Mother   . Cancer Mother 39    non hodgkin lymphoma  . Other Mother     bilateral renal stenosis  . Stroke Father 42  . Aneurysm Father 15    brain  . Cancer Father     SCC, BCC  . Other Father     pituatary adenoma  . Cancer Brother     basal   . Hypertension Maternal Grandmother   . Heart attack Maternal Grandmother   . Other Maternal Grandmother     ras  . Heart attack Maternal Grandfather   . Stroke Paternal Grandmother   . Emphysema Paternal Grandfather     smoker  . COPD Paternal Grandfather     smoker  . Other Maternal Aunt     RAS/RAS  . Hypertension Maternal Aunt   . Colon cancer Neg Hx     Social History   Social History  . Marital status: Married    Spouse name: N/A  . Number of children: 2  . Years of education: master's   Occupational History  . [employer]    Social History Main Topics  . Smoking status: Never Smoker  . Smokeless tobacco: Never Used  . Alcohol use 2.4 oz/week    4 Glasses of wine per week     Comment: social  . Drug use: No  . Sexual activity: Yes    Partners: Male  Comment: lives with husband, no dietary restrictions, works with [employer]   Other Topics Concern  . Not on file   Social History Narrative  . No narrative on file    Outpatient Medications Prior to Visit  Medication Sig Dispense Refill  . ALPRAZolam (XANAX) 0.5 MG tablet Take 0.5-1 tablets (0.25-0.5 mg total) by mouth daily as needed for anxiety. 30 tablet 0  . escitalopram (LEXAPRO) 10 MG tablet Take 1 tablet (10 mg total) by mouth daily. 30 tablet 3  . levothyroxine (SYNTHROID, LEVOTHROID) 50 MCG tablet TAKE 1 TABLET BY MOUTH DAILY. 90 tablet 0  . Multiple Vitamin (MULTIVITAMIN WITH MINERALS) TABS tablet Take 1 tablet by mouth daily.    . ranitidine (ZANTAC) 300 MG tablet Take 1 tablet (300 mg total) by mouth daily as needed for heartburn. 30 tablet 3  . tretinoin (RETIN-A) 0.05 % cream Apply topically at bedtime. 45 g 8  . HYDROcodone-acetaminophen (NORCO) 10-325 MG tablet Take 1 tablet  by mouth every 6 (six) hours as needed. 10 tablet 0   No facility-administered medications prior to visit.     Allergies  Allergen Reactions  . Avelox [Moxifloxacin Hcl In Nacl] Anaphylaxis  . Penicillins Anaphylaxis  . Codeine     GI upset  . Erythromycin Nausea And Vomiting  . Macrodantin [Nitrofurantoin Macrocrystal]     Burning throat  . Adhesive [Tape] Rash  . Aspirin Diarrhea, Nausea Only and Anxiety    Review of Systems  Constitutional: Negative for chills, fever and malaise/fatigue.  HENT: Negative for congestion and hearing loss.   Eyes: Negative for discharge.  Respiratory: Negative for cough, sputum production and shortness of breath.   Cardiovascular: Negative for chest pain, palpitations and leg swelling.  Gastrointestinal: Negative for abdominal pain, blood in stool, constipation, diarrhea, heartburn, nausea and vomiting.  Genitourinary: Negative for dysuria, frequency, hematuria and urgency.  Musculoskeletal: Negative for back pain, falls and myalgias.  Skin: Negative for rash.  Neurological: Negative for dizziness, sensory change, loss of consciousness, weakness and headaches.  Endo/Heme/Allergies: Negative for environmental allergies. Does not bruise/bleed easily.  Psychiatric/Behavioral: Negative for depression and suicidal ideas. The patient is not nervous/anxious and does not have insomnia.        Objective:    Physical Exam  Constitutional: She is oriented to person, place, and time. She appears well-developed and well-nourished. No distress.  HENT:  Head: Normocephalic and atraumatic.  Eyes: Conjunctivae are normal.  Neck: Neck supple. No thyromegaly present.  Cardiovascular: Normal rate, regular rhythm and normal heart sounds.   No murmur heard. Pulmonary/Chest: Effort normal and breath sounds normal. No respiratory distress.  Abdominal: Soft. Bowel sounds are normal. She exhibits no distension and no mass. There is no tenderness.  Musculoskeletal:  She exhibits no edema.  Lymphadenopathy:    She has no cervical adenopathy.  Neurological: She is alert and oriented to person, place, and time.  Skin: Skin is warm and dry.  Right temple 4 mm raised, gray circular, waxy lesion  Psychiatric: She has a normal mood and affect. Her behavior is normal.    BP 124/69 (BP Location: Left Arm, Patient Position: Sitting, Cuff Size: Normal)   Pulse 63   Temp 98.3 F (36.8 C) (Oral)   Ht 5\' 5"  (1.651 m)   Wt 153 lb 8 oz (69.6 kg)   LMP 11/14/2013   SpO2 98%   BMI 25.54 kg/m  Wt Readings from Last 3 Encounters:  10/23/16 153 lb 8 oz (69.6 kg)  01/28/16 151  lb (68.5 kg)  10/19/15 149 lb (67.6 kg)     Lab Results  Component Value Date   WBC 6.1 10/19/2015   HGB 13.0 10/19/2015   HCT 38.8 10/19/2015   PLT 249.0 10/19/2015   GLUCOSE 91 10/19/2015   CHOL 186 10/19/2015   TRIG 83.0 10/19/2015   HDL 48.80 10/19/2015   LDLCALC 121 (H) 10/19/2015   ALT 27 10/19/2015   AST 22 10/19/2015   NA 139 10/19/2015   K 4.2 10/19/2015   CL 104 10/19/2015   CREATININE 0.85 10/19/2015   BUN 11 10/19/2015   CO2 30 10/19/2015   TSH 3.39 10/19/2015    Lab Results  Component Value Date   TSH 3.39 10/19/2015   Lab Results  Component Value Date   WBC 6.1 10/19/2015   HGB 13.0 10/19/2015   HCT 38.8 10/19/2015   MCV 92.4 10/19/2015   PLT 249.0 10/19/2015   Lab Results  Component Value Date   NA 139 10/19/2015   K 4.2 10/19/2015   CO2 30 10/19/2015   GLUCOSE 91 10/19/2015   BUN 11 10/19/2015   CREATININE 0.85 10/19/2015   BILITOT 0.5 10/19/2015   ALKPHOS 59 10/19/2015   AST 22 10/19/2015   ALT 27 10/19/2015   PROT 7.4 10/19/2015   ALBUMIN 4.3 10/19/2015   CALCIUM 10.0 10/19/2015   ANIONGAP 14 07/28/2014   GFR 73.99 10/19/2015   Lab Results  Component Value Date   CHOL 186 10/19/2015   Lab Results  Component Value Date   HDL 48.80 10/19/2015   Lab Results  Component Value Date   LDLCALC 121 (H) 10/19/2015   Lab Results    Component Value Date   TRIG 83.0 10/19/2015   Lab Results  Component Value Date   CHOLHDL 4 10/19/2015   No results found for: HGBA1C     Assessment & Plan:   Problem List Items Addressed This Visit    Vitamin D deficiency   Relevant Orders   Vitamin D (25 hydroxy)   Preventative health care - Primary    Patient encouraged to maintain heart healthy diet, regular exercise, adequate sleep. Consider daily probiotics. Take medications as prescribed. She has ACPs encourged to bring [place] a copy      Relevant Orders   Comprehensive metabolic panel   TSH   CBC   Hypothyroidism    On Levothyroxine, continue to monitor      Hyperlipidemia, mixed    Encouraged heart healthy diet, increase exercise, avoid trans fats, consider a krill oil cap daily      Depression    Doing well on Lexapro       Other Visit Diagnoses    Encounter for immunization       Relevant Orders   Flu Vaccine QUAD 36+ mos IM (Completed)   Lipid panel   Comprehensive metabolic panel   TSH   CBC   Mixed hyperlipidemia       Relevant Orders   Lipid panel      I have discontinued Ms. Pichette's HYDROcodone-acetaminophen. I am also having her maintain her multivitamin with minerals, tretinoin, ranitidine, ALPRAZolam, escitalopram, and levothyroxine.  No orders of the defined types were placed in this encounter.    Penni Homans, MD

## 2016-10-23 NOTE — Assessment & Plan Note (Signed)
Patient encouraged to maintain heart healthy diet, regular exercise, adequate sleep. Consider daily probiotics. Take medications as prescribed. She has ACPs encourged to bring [place] a copy

## 2016-10-23 NOTE — Progress Notes (Signed)
Pre visit review using our clinic review tool, if applicable. No additional management support is needed unless otherwise documented below in the visit note. 

## 2016-11-19 ENCOUNTER — Other Ambulatory Visit: Payer: Self-pay | Admitting: Family Medicine

## 2016-11-19 MED FILL — LEVOTHYROXINE 50 MCG TABLET: 50 | 90 days supply | Qty: 90 | Fill #0

## 2016-11-20 ENCOUNTER — Other Ambulatory Visit: Payer: Self-pay | Admitting: Family Medicine

## 2016-11-20 MED ORDER — ESCITALOPRAM OXALATE 10 MG PO TABS: 10.0000 mg | ORAL_TABLET | Freq: Every day | ORAL | 3 refills | Status: DC

## 2016-11-20 MED FILL — ESCITALOPRAM 10 MG TABLET: 10 | 90 days supply | Qty: 90 | Fill #0

## 2016-12-01 ENCOUNTER — Telehealth: Payer: Self-pay | Admitting: Family Medicine

## 2016-12-01 MED ORDER — TRETINOIN 0.05 % EX CREA: TOPICAL_CREAM | Freq: Every day | CUTANEOUS | 99 refills | Status: AC

## 2016-12-01 MED FILL — TRETINOIN 0.05% CREAM: 0.05 | 90 days supply | Qty: 45 | Fill #0

## 2016-12-01 NOTE — Telephone Encounter (Signed)
Patient is requesting a refill of tretinoin (RETIN-A) 0.05 % cream Please advise.   Pharmacy: [place], [place] - [address] [facility].

## 2016-12-03 NOTE — Telephone Encounter (Signed)
Called to let the patient know a PA was initiated on this cream yesterday and checked with covermymeds and still no decision has been made.  May take 5 to 10 business day. Did inform the patient of this information.

## 2016-12-03 NOTE — Telephone Encounter (Signed)
Patient checking on the status of medication below, informed patient PCP is out of the office requesting covering provider refill Rx, please advise

## 2016-12-11 NOTE — Telephone Encounter (Signed)
Received PA  Approval from today through 12/02/2017. Patient ID# [id number] Notified the patient left a detailed message of approval.

## 2017-01-13 ENCOUNTER — Other Ambulatory Visit: Payer: Self-pay | Admitting: Family Medicine

## 2017-01-13 DIAGNOSIS — Z1231 Encounter for screening mammogram for malignant neoplasm of breast: Secondary | ICD-10-CM

## 2017-02-12 ENCOUNTER — Ambulatory Visit
Admission: RE | Admit: 2017-02-12 | Discharge: 2017-02-12 | Disposition: A | Discharge status: Home / Self Care | Payer: 59 | Source: Ambulatory Visit | Attending: Family Medicine | Admitting: Family Medicine

## 2017-02-12 DIAGNOSIS — Z1231 Encounter for screening mammogram for malignant neoplasm of breast: Secondary | ICD-10-CM

## 2017-03-10 ENCOUNTER — Other Ambulatory Visit: Payer: Self-pay | Admitting: Family Medicine

## 2017-03-10 ENCOUNTER — Encounter: Payer: Self-pay | Admitting: Family Medicine

## 2017-03-10 MED ORDER — LEVOTHYROXINE SODIUM 50 MCG PO TABS: 50.0000 ug | ORAL_TABLET | Freq: Every day | ORAL | 1 refills | Status: DC

## 2017-05-13 ENCOUNTER — Telehealth: Payer: Self-pay | Admitting: Family Medicine

## 2017-05-13 DIAGNOSIS — F411 Generalized anxiety disorder: Secondary | ICD-10-CM

## 2017-05-13 MED ORDER — LEVOTHYROXINE SODIUM 50 MCG PO TABS: 50.0000 ug | ORAL_TABLET | Freq: Every day | ORAL | 0 refills | Status: AC

## 2017-05-13 NOTE — Telephone Encounter (Signed)
Ok to refill meds including Alprazolam with 30 day supply and 1 rf needs UDS and contract

## 2017-05-13 NOTE — Telephone Encounter (Signed)
Caller name: Louanne Relation to pt: self Call back number: [phone] Pharmacy: [facility] [address] [place] [address] [place] for call: Pt is requesting refill on rx levothyroxine (SYNTHROID, LEVOTHROID) 50 MCG tablet,  escitalopram (LEXAPRO) 10 MG tablet and  ALPRAZolam (XANAX) 0.5 MG tablet. Please advise. Pt is wanting to have rx sent to pharmacy mentioned above ([facility] at [place]).

## 2017-05-13 NOTE — Telephone Encounter (Signed)
Last refill #30 on 07/24/2015 Last office visit 10/23/2016 Refill request for Alprazolam No UDS/No Contract

## 2017-05-13 NOTE — Addendum Note (Signed)
Addended by: Sharon Seller B on: 05/13/2017 01:19 PM   Modules accepted: Orders

## 2017-05-13 NOTE — Addendum Note (Signed)
Addended by: Sharon Seller B on: 05/13/2017 01:54 PM   Modules accepted: Orders

## 2017-05-14 ENCOUNTER — Encounter: Payer: Self-pay | Admitting: Family Medicine

## 2017-05-14 MED ORDER — ESCITALOPRAM OXALATE 10 MG PO TABS: 10.0000 mg | ORAL_TABLET | Freq: Every day | ORAL | 3 refills | Status: AC

## 2017-05-14 MED ORDER — ALPRAZOLAM 0.5 MG PO TABS: 0.2500 mg | ORAL_TABLET | Freq: Every day | ORAL | 0 refills | Status: AC | PRN

## 2017-05-14 NOTE — Addendum Note (Signed)
Addended by: Sharon Seller B on: 05/14/2017 07:35 AM   Modules accepted: Orders

## 2017-05-14 NOTE — Telephone Encounter (Signed)
Called the patient informed to pickup hardcopy/complete contract/do UDS She agreed to do

## 2017-05-21 ENCOUNTER — Encounter: Payer: Self-pay | Admitting: Family Medicine

## 2017-09-17 IMAGING — DX DG LUMBAR SPINE COMPLETE 4+V
5 series · 5 of 5 positions shown · non-contrast
Comparison: None.

CLINICAL DATA: Right hip/ low back pain with radiculopathy. No
reported injury.

EXAM:
LUMBAR SPINE - COMPLETE 4+ VIEW

[l-spine ap]
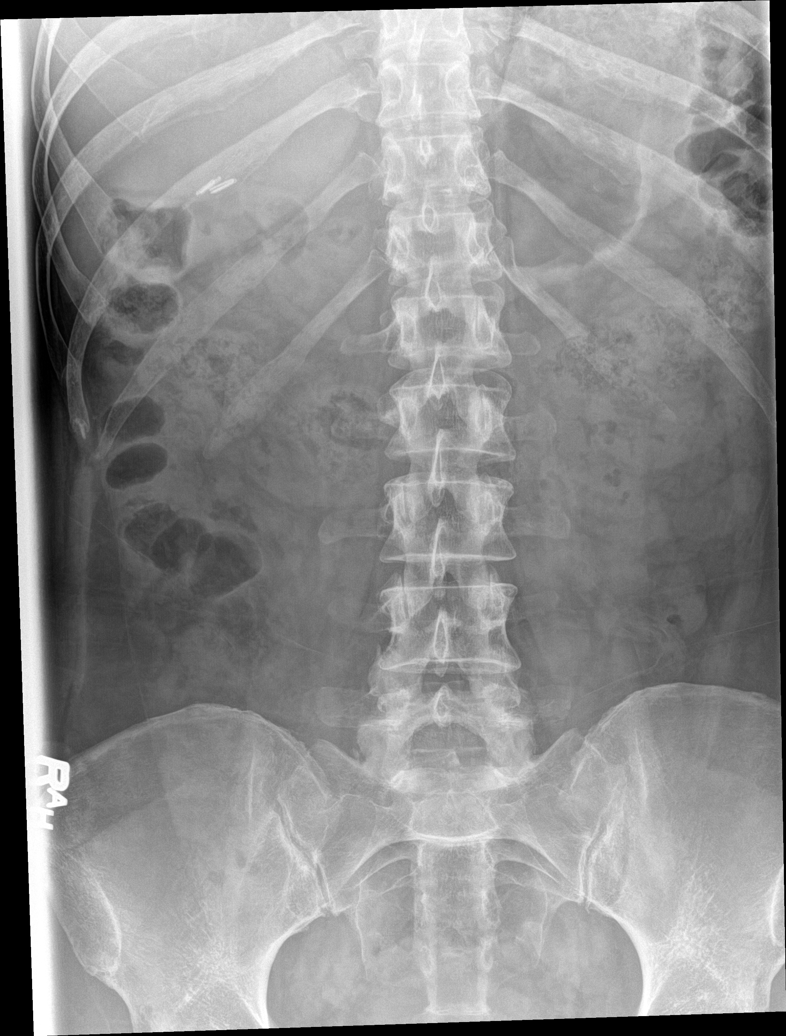

[l-spine obl (1 of 2)]
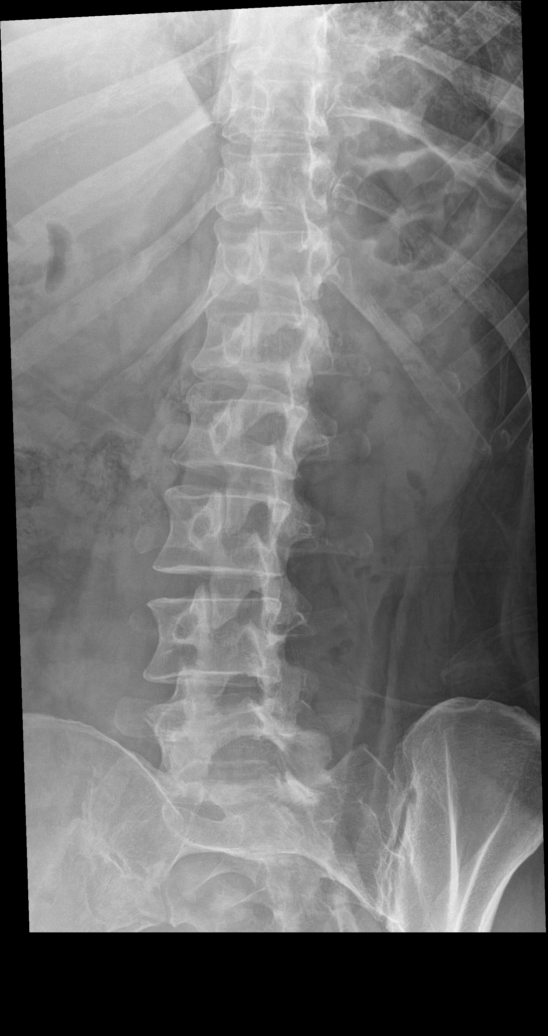

[l-spine obl (2 of 2)]
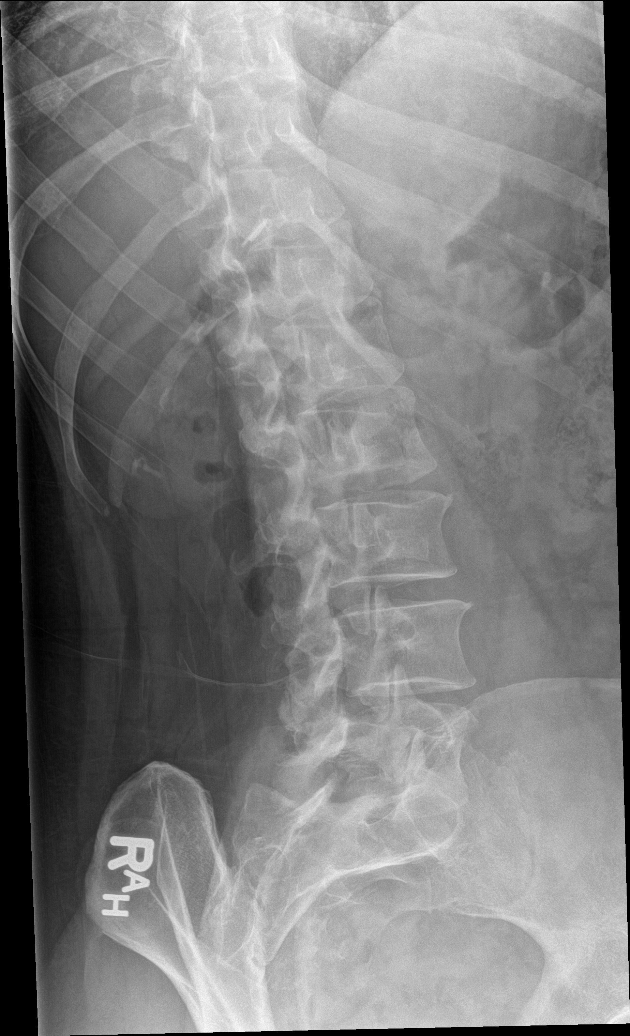

[l-spine lat]
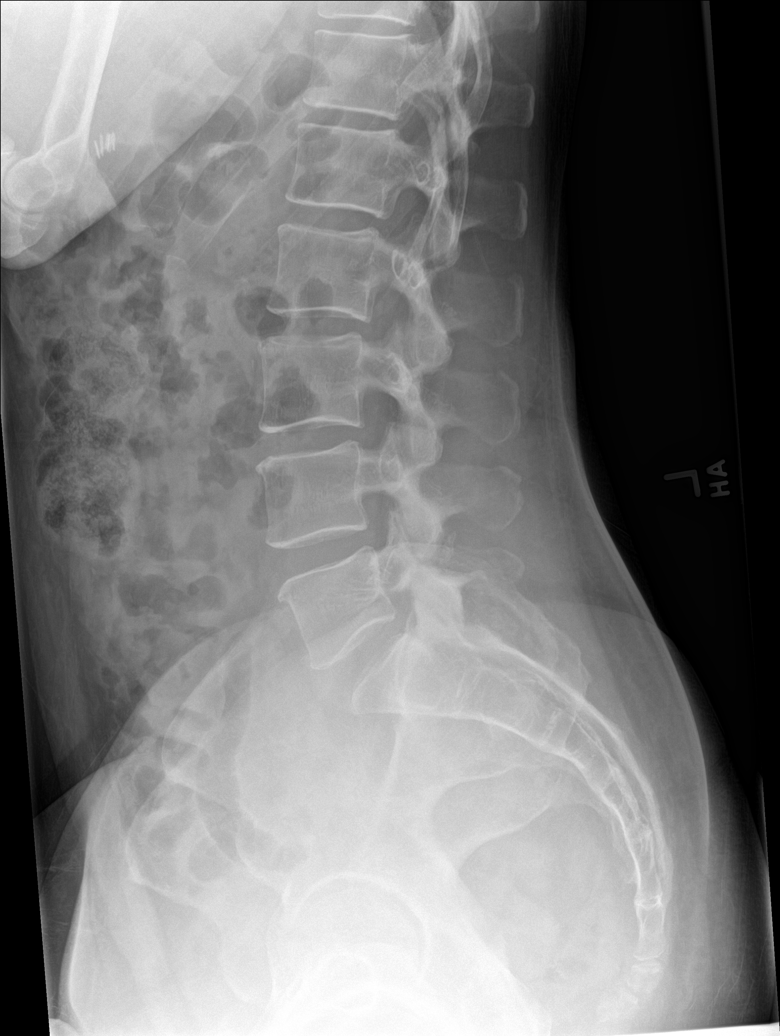

[l-spine spot]
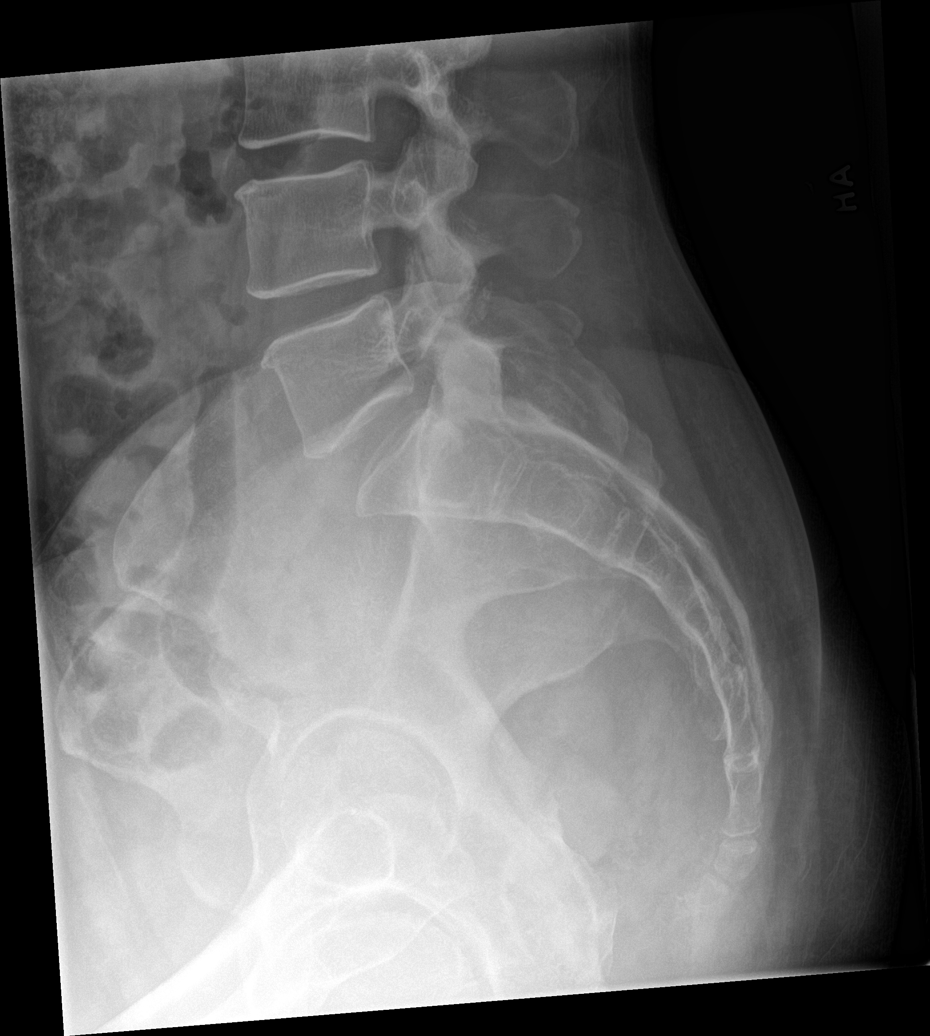

[5 of 5 positions shown; findings below may reference images not displayed]

FINDINGS: This report assumes 5 non rib-bearing lumbar vertebrae.

Lumbar vertebral body heights are preserved, with no fracture or
suspicious focal osseous lesion.

There is mild loss of disc height at L3-4. Minimal spondylotic
changes are present at L3-4 and L4-5. No spondylolisthesis. Mild
bilateral facet arthropathy in the lower lumbar spine. No
appreciable foraminal stenosis. Right upper quadrant cholecystectomy
clips.
IMPRESSION: Mild degenerative disc disease at L3-4. Mild facet arthropathy in
the lower lumbar spine.

## 2017-09-21 IMAGING — US US PELVIS COMPLETE
1 series · 15 of 25 positions shown · non-contrast
Comparison: None

CLINICAL DATA: Right lower quadrant pain, pelvic pain. History of
cervical polyp. History of C-section. Postmenopausal.

EXAM:
TRANSABDOMINAL AND TRANSVAGINAL ULTRASOUND OF PELVIS
TECHNIQUE: Both transabdominal and transvaginal ultrasound examinations of the
pelvis were performed. Transabdominal technique was performed for
global imaging of the pelvis including uterus, ovaries, adnexal
regions, and pelvic cul-de-sac. It was necessary to proceed with
endovaginal exam following the transabdominal exam to visualize the
endometrial complex and adnexal regions to an adequate degree.

[Series 1: us pelvis complete · 15 of 48 slices shown]
[im 1/48]
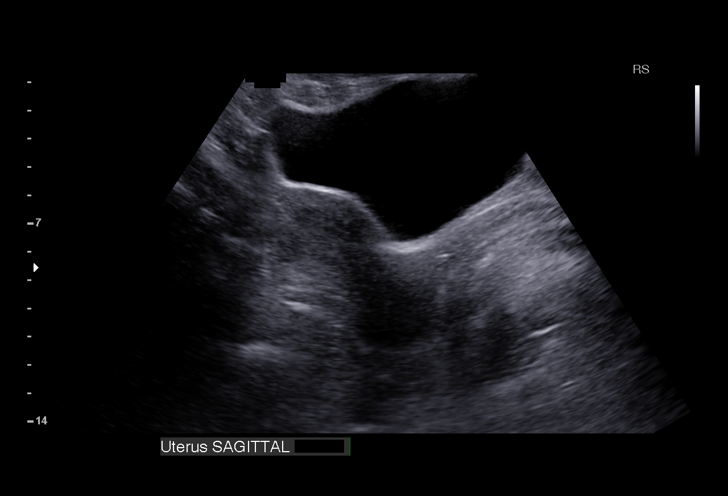
[im 4/48]
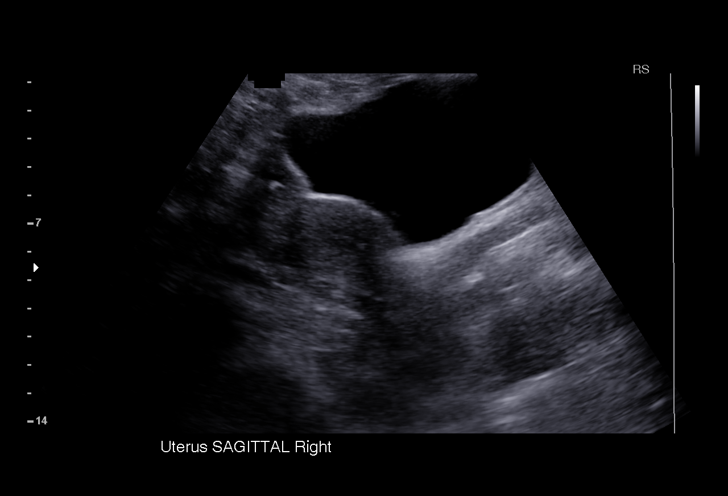
[im 8/48]
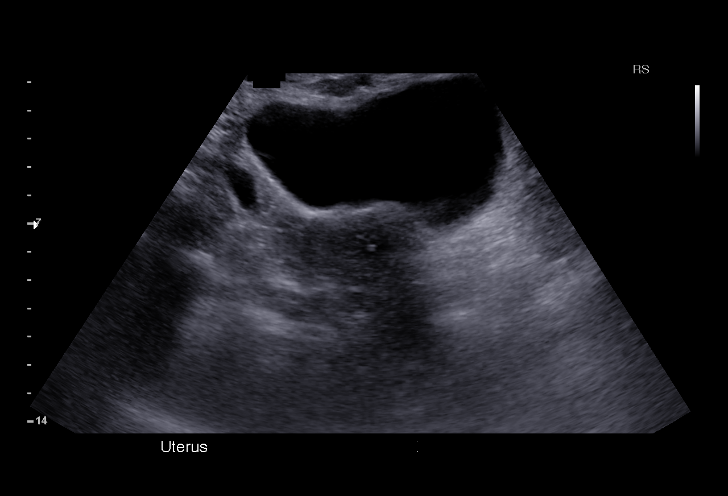
[im 10/48]
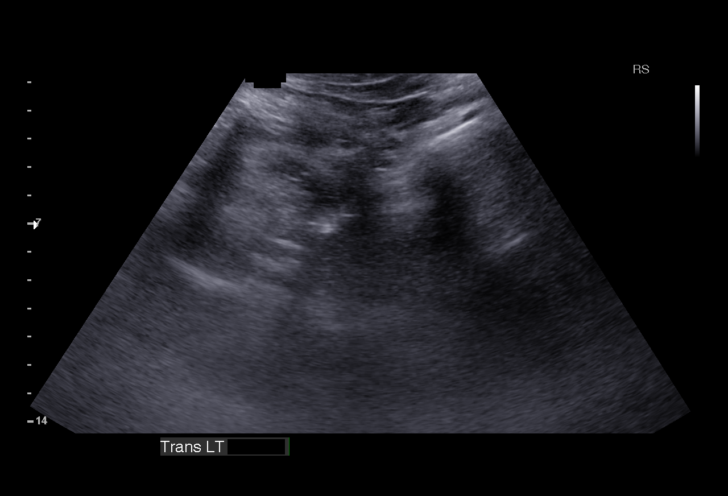
[im 14/48]
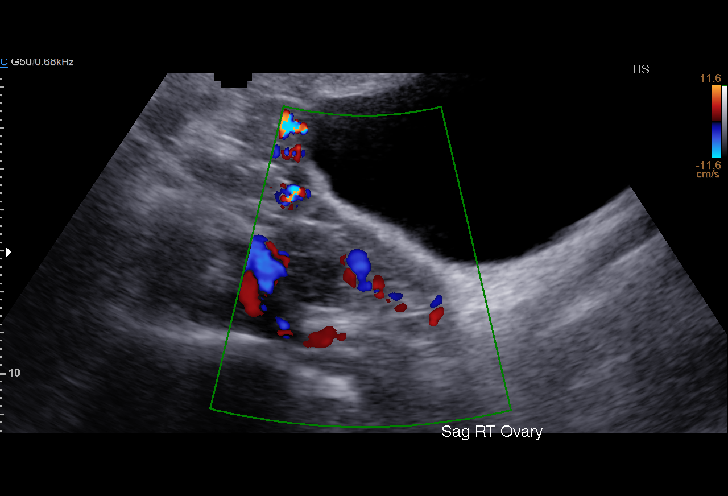
[im 18/48]
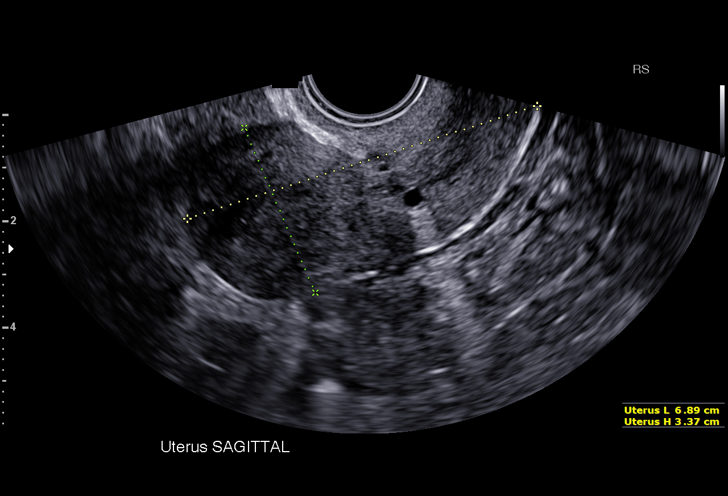
[im 20/48]
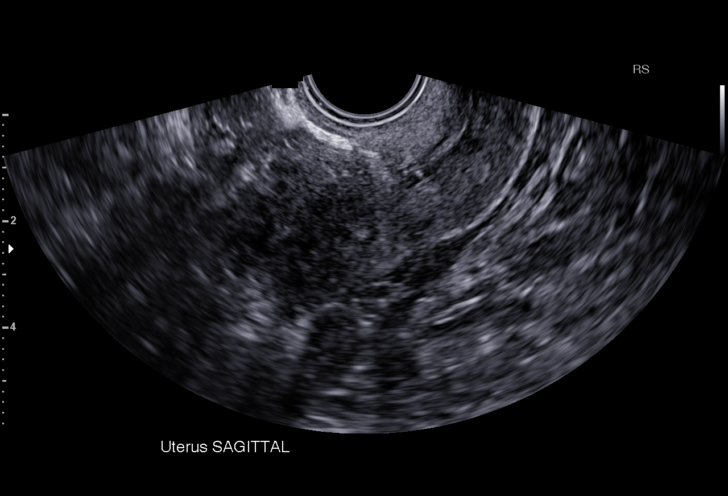
[im 24/48]
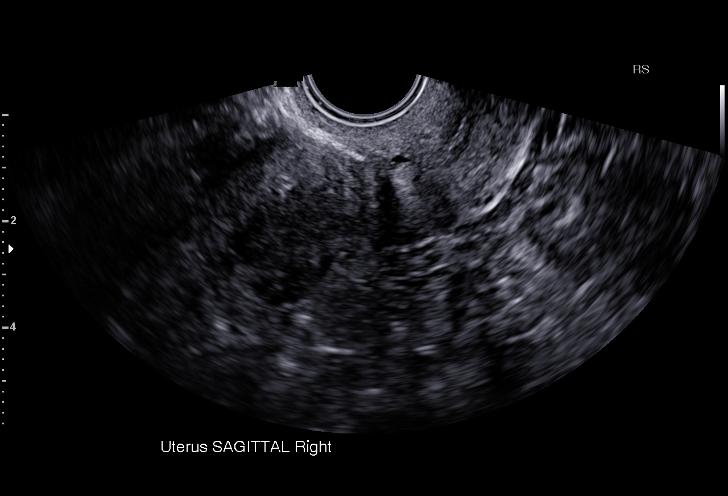
[im 28/48]
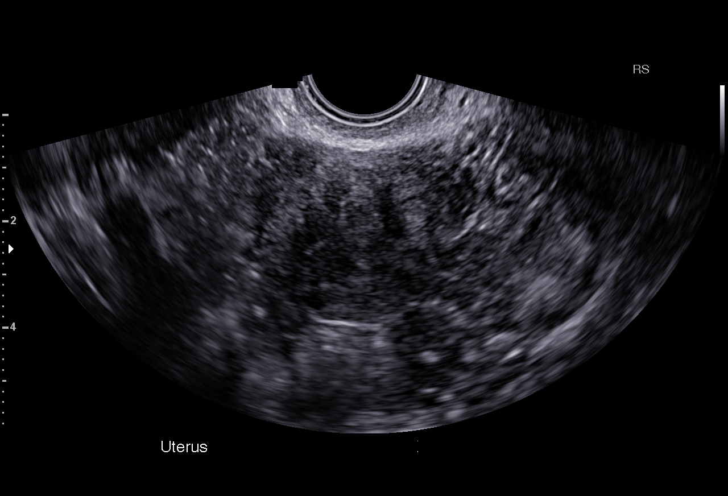
[im 30/48]
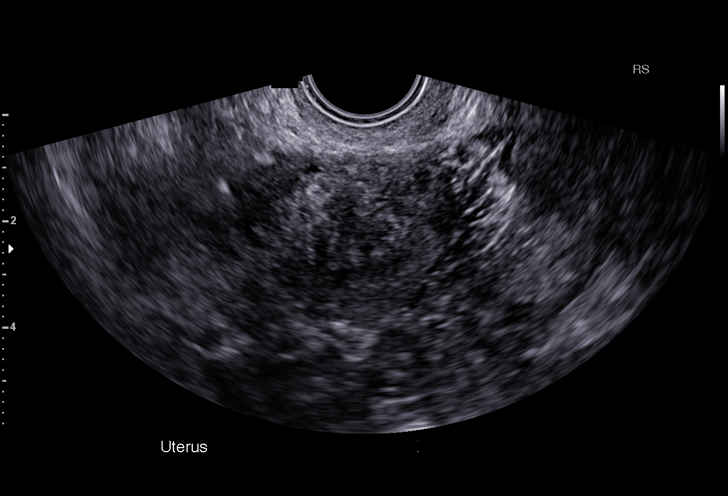
[im 34/48]
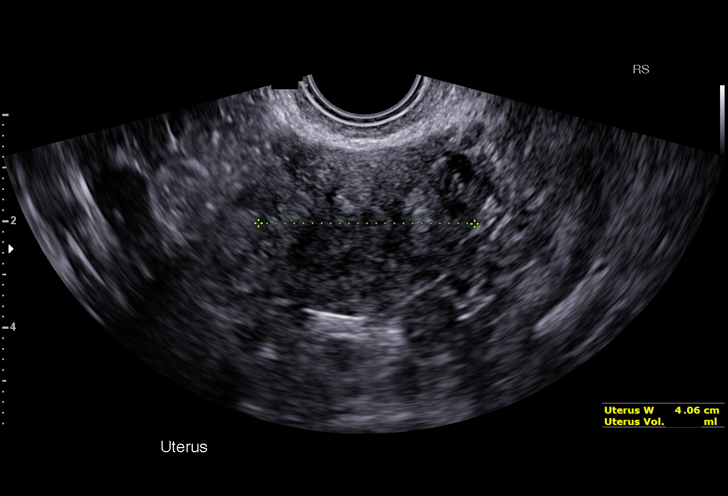
[im 38/48]
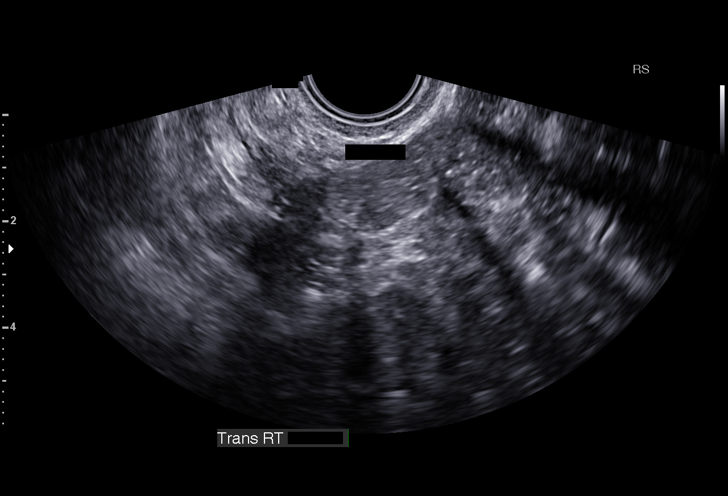
[im 40/48]
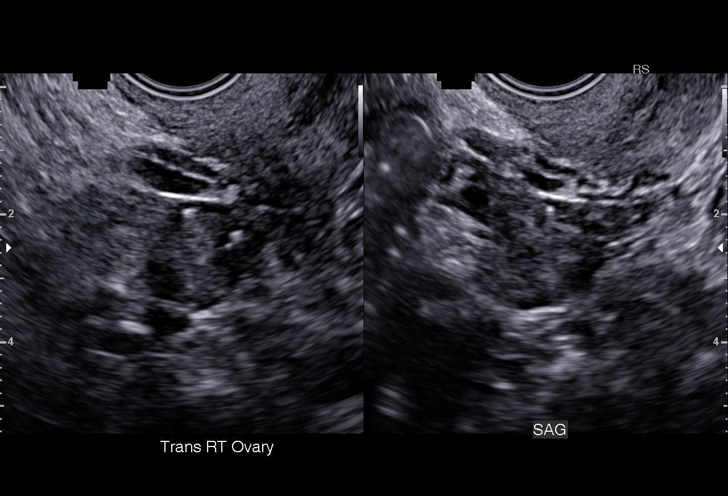
[im 44/48]
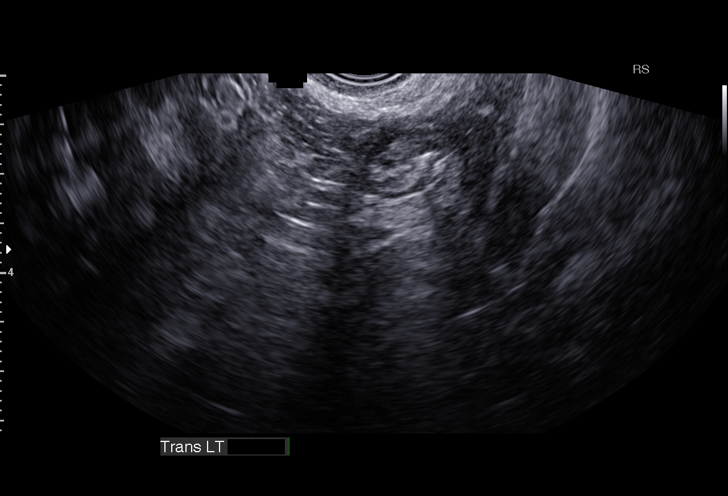
[im 48/48]
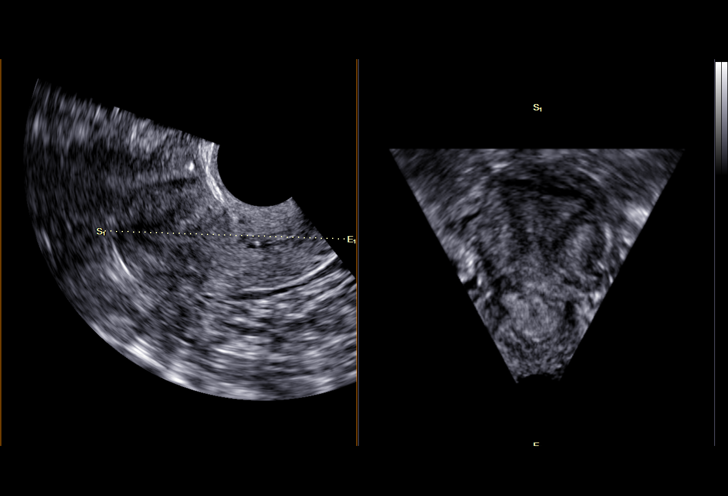

[15 of 25 positions shown; findings below may reference images not displayed]

FINDINGS: Uterus

Measurements: 6.9 x 3.4 x 4.1 cm. Uterine myometrium is somewhat
heterogeneous throughout, but without circumscribed mass or lesion.
This heterogeneity may be attributable to patient's previous
C-section. It may indicate adenomyosis.

Endometrium

Thickness: Normal in thickness at 5 mm.. No mass or fluid identified
within the endometrial canal.

Right ovary

Measurements: 2.1 x 1.7 x 1.5 cm. Normal appearance/no adnexal mass.

Left ovary

Left ovary not seen, presumably obscured by overlying bowel, but
there is no mass or free fluid identified in the left adnexa.

Other findings

No free fluid.
IMPRESSION: No acute findings. No mass or fluid within the endometrial canal. No
adnexal mass or free fluid.

Uterus somewhat heterogeneous in appearance, of uncertain
significance. This may be related to the given history of previous
C-section, less likely adenomyosis.

## 2023-06-19 ENCOUNTER — Encounter: Payer: Self-pay | Admitting: Internal Medicine
